# Patient Record
Sex: Female | Born: 1945 | Race: White | Hispanic: No | Marital: Married | Smoking: Former smoker
Health system: Southern US, Community
[De-identification: ages and names within clinical notes are randomized; demographics above are authoritative.]

## PROBLEM LIST (undated history)

## (undated) DIAGNOSIS — E782 Mixed hyperlipidemia: Secondary | ICD-10-CM

## (undated) DIAGNOSIS — E079 Disorder of thyroid, unspecified: Secondary | ICD-10-CM

## (undated) DIAGNOSIS — I10 Essential (primary) hypertension: Secondary | ICD-10-CM

## (undated) DIAGNOSIS — C4490 Unspecified malignant neoplasm of skin, unspecified: Secondary | ICD-10-CM

## (undated) DIAGNOSIS — H9192 Unspecified hearing loss, left ear: Secondary | ICD-10-CM

## (undated) DIAGNOSIS — K219 Gastro-esophageal reflux disease without esophagitis: Secondary | ICD-10-CM

## (undated) DIAGNOSIS — T7840XA Allergy, unspecified, initial encounter: Secondary | ICD-10-CM

## (undated) DIAGNOSIS — L409 Psoriasis, unspecified: Secondary | ICD-10-CM

## (undated) DIAGNOSIS — K635 Polyp of colon: Secondary | ICD-10-CM

## (undated) HISTORY — DX: Allergy, unspecified, initial encounter: T78.40XA

## (undated) HISTORY — DX: Essential (primary) hypertension: I10

## (undated) HISTORY — DX: Gastro-esophageal reflux disease without esophagitis: K21.9

## (undated) HISTORY — PX: WRIST SURGERY: SHX841

## (undated) HISTORY — DX: Unspecified hearing loss, left ear: H91.92

## (undated) HISTORY — DX: Polyp of colon: K63.5

## (undated) HISTORY — PX: ABDOMINAL HYSTERECTOMY: SHX81

## (undated) HISTORY — DX: Disorder of thyroid, unspecified: E07.9

## (undated) HISTORY — DX: Unspecified malignant neoplasm of skin, unspecified: C44.90

## (undated) HISTORY — DX: Psoriasis, unspecified: L40.9

## (undated) HISTORY — DX: Mixed hyperlipidemia: E78.2

## (undated) HISTORY — PX: COLONOSCOPY: SHX174

---

## 1998-07-13 ENCOUNTER — Ambulatory Visit (HOSPITAL_COMMUNITY): Admission: RE | Admit: 1998-07-13 | Discharge: 1998-07-13 | Payer: Self-pay | Admitting: Obstetrics and Gynecology

## 1998-09-07 ENCOUNTER — Other Ambulatory Visit: Admission: RE | Admit: 1998-09-07 | Discharge: 1998-09-07 | Payer: Self-pay | Admitting: Obstetrics and Gynecology

## 1999-07-19 ENCOUNTER — Encounter: Payer: Self-pay | Admitting: Obstetrics and Gynecology

## 1999-07-19 ENCOUNTER — Ambulatory Visit (HOSPITAL_COMMUNITY): Admission: RE | Admit: 1999-07-19 | Discharge: 1999-07-19 | Payer: Self-pay | Admitting: Obstetrics and Gynecology

## 1999-09-28 ENCOUNTER — Other Ambulatory Visit: Admission: RE | Admit: 1999-09-28 | Discharge: 1999-09-28 | Payer: Self-pay | Admitting: Obstetrics and Gynecology

## 1999-11-03 ENCOUNTER — Encounter (INDEPENDENT_AMBULATORY_CARE_PROVIDER_SITE_OTHER): Payer: Self-pay | Admitting: Specialist

## 1999-11-03 ENCOUNTER — Other Ambulatory Visit: Admission: RE | Admit: 1999-11-03 | Discharge: 1999-11-03 | Payer: Self-pay | Admitting: Internal Medicine

## 1999-11-06 ENCOUNTER — Emergency Department (HOSPITAL_COMMUNITY): Admission: EM | Admit: 1999-11-06 | Discharge: 1999-11-06 | Payer: Self-pay | Admitting: Emergency Medicine

## 2000-07-24 ENCOUNTER — Encounter: Payer: Self-pay | Admitting: Emergency Medicine

## 2000-07-24 ENCOUNTER — Ambulatory Visit (HOSPITAL_COMMUNITY): Admission: RE | Admit: 2000-07-24 | Discharge: 2000-07-24 | Payer: Self-pay | Admitting: Obstetrics and Gynecology

## 2000-11-15 ENCOUNTER — Encounter: Payer: Self-pay | Admitting: Emergency Medicine

## 2000-11-15 ENCOUNTER — Encounter: Admission: RE | Admit: 2000-11-15 | Discharge: 2000-11-15 | Payer: Self-pay | Admitting: Emergency Medicine

## 2001-09-03 ENCOUNTER — Encounter: Payer: Self-pay | Admitting: Emergency Medicine

## 2001-09-03 ENCOUNTER — Ambulatory Visit (HOSPITAL_COMMUNITY): Admission: RE | Admit: 2001-09-03 | Discharge: 2001-09-03 | Payer: Self-pay | Admitting: Emergency Medicine

## 2002-09-03 ENCOUNTER — Ambulatory Visit (HOSPITAL_COMMUNITY): Admission: RE | Admit: 2002-09-03 | Discharge: 2002-09-03 | Payer: Self-pay | Admitting: Obstetrics and Gynecology

## 2002-09-03 ENCOUNTER — Encounter: Payer: Self-pay | Admitting: Obstetrics and Gynecology

## 2003-09-08 ENCOUNTER — Ambulatory Visit (HOSPITAL_COMMUNITY): Admission: RE | Admit: 2003-09-08 | Discharge: 2003-09-08 | Payer: Self-pay | Admitting: Obstetrics and Gynecology

## 2003-09-08 ENCOUNTER — Encounter: Payer: Self-pay | Admitting: Obstetrics and Gynecology

## 2004-09-13 ENCOUNTER — Ambulatory Visit (HOSPITAL_COMMUNITY): Admission: RE | Admit: 2004-09-13 | Discharge: 2004-09-13 | Payer: Self-pay | Admitting: Obstetrics and Gynecology

## 2005-09-19 ENCOUNTER — Ambulatory Visit (HOSPITAL_COMMUNITY): Admission: RE | Admit: 2005-09-19 | Discharge: 2005-09-19 | Payer: Self-pay | Admitting: Obstetrics and Gynecology

## 2006-09-24 ENCOUNTER — Ambulatory Visit (HOSPITAL_COMMUNITY): Admission: RE | Admit: 2006-09-24 | Discharge: 2006-09-24 | Payer: Self-pay | Admitting: Obstetrics and Gynecology

## 2006-10-09 ENCOUNTER — Encounter: Admission: RE | Admit: 2006-10-09 | Discharge: 2006-10-09 | Payer: Self-pay | Admitting: Obstetrics and Gynecology

## 2007-09-30 ENCOUNTER — Ambulatory Visit (HOSPITAL_COMMUNITY): Admission: RE | Admit: 2007-09-30 | Discharge: 2007-09-30 | Payer: Self-pay | Admitting: Obstetrics and Gynecology

## 2008-10-01 ENCOUNTER — Ambulatory Visit (HOSPITAL_COMMUNITY): Admission: RE | Admit: 2008-10-01 | Discharge: 2008-10-01 | Payer: Self-pay | Admitting: Obstetrics and Gynecology

## 2009-10-07 ENCOUNTER — Ambulatory Visit (HOSPITAL_COMMUNITY): Admission: RE | Admit: 2009-10-07 | Discharge: 2009-10-07 | Payer: Self-pay | Admitting: Obstetrics and Gynecology

## 2010-01-26 ENCOUNTER — Encounter (INDEPENDENT_AMBULATORY_CARE_PROVIDER_SITE_OTHER): Payer: Self-pay | Admitting: *Deleted

## 2010-01-27 ENCOUNTER — Ambulatory Visit: Payer: Self-pay | Admitting: Internal Medicine

## 2010-02-22 ENCOUNTER — Ambulatory Visit: Payer: Self-pay | Admitting: Internal Medicine

## 2010-10-11 ENCOUNTER — Ambulatory Visit (HOSPITAL_COMMUNITY): Admission: RE | Admit: 2010-10-11 | Discharge: 2010-10-11 | Payer: Self-pay | Admitting: Obstetrics and Gynecology

## 2010-10-29 ENCOUNTER — Ambulatory Visit: Payer: Self-pay | Admitting: Diagnostic Radiology

## 2010-10-29 ENCOUNTER — Emergency Department (HOSPITAL_BASED_OUTPATIENT_CLINIC_OR_DEPARTMENT_OTHER): Admission: EM | Admit: 2010-10-29 | Discharge: 2010-10-29 | Payer: Self-pay | Admitting: Emergency Medicine

## 2010-11-02 ENCOUNTER — Encounter: Admission: RE | Admit: 2010-11-02 | Discharge: 2010-11-02 | Payer: Self-pay | Admitting: Family Medicine

## 2011-01-07 ENCOUNTER — Encounter: Payer: Self-pay | Admitting: Obstetrics and Gynecology

## 2011-01-07 ENCOUNTER — Encounter: Payer: Self-pay | Admitting: Emergency Medicine

## 2011-01-17 NOTE — Letter (Signed)
Summary: [facility] Instructions  [facility]  [address] [place], [place] [address]   Phone: [phone]  Fax: [phone]       Terri Sosa    Feb 14, 1946    MRN: [id number]       Procedure Day Dorna Bloom:  Lenor Coffin  02/10/2010     Arrival Time: 7:30AM     Procedure Time:  8:30AM     Location of Procedure:                    _ X_  [facility] (4th Floor)        PREPARATION FOR COLONOSCOPY WITH MIRALAX  Starting 5 days prior to your procedure 02/05/10 do not eat nuts, seeds, popcorn, corn, beans, peas,  salads, or any raw vegetables.  Do not take any fiber supplements (e.g. Metamucil, Citrucel, and Benefiber). ____________________________________________________________________________________________________   THE DAY BEFORE YOUR PROCEDURE         DATE: 02/09/10 DAY: WEDNESDAY  1   Drink clear liquids the entire day-NO SOLID FOOD  2   Do not drink anything colored red or purple.  Avoid juices with pulp.  No orange juice.  3   Drink at least 64 oz. (8 glasses) of fluid/clear liquids during the day to prevent dehydration and help the prep work efficiently.  CLEAR LIQUIDS INCLUDE: Water Jello Ice Popsicles Tea (sugar ok, no milk/cream) Powdered fruit flavored drinks Coffee (sugar ok, no milk/cream) Gatorade Juice: apple, white grape, white cranberry  Lemonade Clear bullion, consomm, broth Carbonated beverages (any kind) Strained chicken noodle soup Hard Candy  4   Mix the entire bottle of Miralax with 64 oz. of Gatorade/Powerade in the morning and put in the refrigerator to chill.  5   At 3:00 pm take 2 Dulcolax/Bisacodyl tablets.  6   At 4:30 pm take one Reglan/Metoclopramide tablet.  7  Starting at 5:00 pm drink one 8 oz glass of the Miralax mixture every 15-20 minutes until you have finished drinking the entire 64 oz.  You should finish drinking prep around 7:30 or 8:00 pm.  8   If you are nauseated, you may take the 2nd  Reglan/Metoclopramide tablet at 6:30 pm.        9    At 8:00 pm take 2 more DULCOLAX/Bisacodyl tablets.     THE DAY OF YOUR PROCEDURE      DATE:  02/10/10  DAY: Lenor Coffin  You may drink clear liquids until 6:30AM  (2 HOURS BEFORE PROCEDURE).   MEDICATION INSTRUCTIONS  Unless otherwise instructed, you should take regular prescription medications with a small sip of water as early as possible the morning of your procedure.   Additional medication instructions: Hold Lisinopril-HCTZ the morning of procedure.         OTHER INSTRUCTIONS  You will need a responsible adult at least 63 years of age to accompany you and drive you home.   This person must remain in the waiting room during your procedure.  Wear loose fitting clothing that is easily removed.  Leave jewelry and other valuables at home.  However, you may wish to bring a book to read or an iPod/MP3 player to listen to music as you wait for your procedure to start.  Remove all body piercing jewelry and leave at home.  Total time from sign-in until discharge is approximately 2-3 hours.  You should go home directly after your procedure and rest.  You can resume normal activities the day after your  procedure.  The day of your procedure you should not:   Drive   Make legal decisions   Operate machinery   Drink alcohol   Return to work  You will receive specific instructions about eating, activities and medications before you leave.   The above instructions have been reviewed and explained to me by   Wyona Almas RN  January 27, 2010 5:13 PM     I fully understand and can verbalize these instructions _____________________________ Date _______

## 2011-01-17 NOTE — Miscellaneous (Signed)
Summary: [facility] Previsit/prep  Clinical Lists Changes  Medications: Added new medication of MIRALAX   POWD (POLYETHYLENE GLYCOL 3350) As per prep  instructions. - Signed Added new medication of METOCLOPRAMIDE HCL 10 MG  TABS (METOCLOPRAMIDE HCL) As per prep instructions. - Signed Added new medication of DULCOLAX 5 MG  TBEC (BISACODYL) Day before procedure take 2 at 3pm and 2 at 8pm. - Signed Rx of MIRALAX   POWD (POLYETHYLENE GLYCOL 3350) As per prep  instructions.;  #255gm x 0;  Signed;  Entered by: Wyona Almas RN;  Authorized by: Hart Carwin MD;  Method used: [facility] to [facility] [phone]*, [address], [place], [place], [place]  [address], Ph: [phone], Fax: [phone] Rx of METOCLOPRAMIDE HCL 10 MG  TABS (METOCLOPRAMIDE HCL) As per prep instructions.;  #2 x 0;  Signed;  Entered by: Wyona Almas RN;  Authorized by: Hart Carwin MD;  Method used: Electronically to [facility] [phone]*, [address] [place], [place], [place]  [address], Ph: [phone], Fax: [phone] Rx of DULCOLAX 5 MG  TBEC (BISACODYL) Day before procedure take 2 at 3pm and 2 at 8pm.;  #4 x 0;  Signed;  Entered by: Wyona Almas RN;  Authorized by: Hart Carwin MD;  Method used: Electronically to [facility] [phone]*, [address], [place], [place]  [address], Ph: [phone], Fax: [phone] Allergies: Added new allergy or adverse reaction of CODEINE Added new allergy or adverse reaction of AUGMENTIN Observations: Added new observation of NKA: F (01/27/2010 16:06)    Prescriptions: DULCOLAX 5 MG  TBEC (BISACODYL) Day before procedure take 2 at 3pm and 2 at 8pm.  #4 x 0   Entered by:   Wyona Almas RN   Authorized by:   Hart Carwin MD   Signed by:   Wyona Almas RN on 01/27/2010   Method used:   Electronically to        [facility] [phone]* (retail)       [address]       [place], [place]   [address]       Ph: [phone]       Fax: [phone]   RxID:   [id number] METOCLOPRAMIDE HCL 10 MG  TABS (METOCLOPRAMIDE HCL) As per prep instructions.  #2 x 0   Entered by:   Wyona Almas RN   Authorized by:   Hart Carwin MD   Signed by:   Wyona Almas RN on 01/27/2010   Method used:   Electronically to        [facility] [phone]* (retail)       [address]       [place], [place]  [address]       Ph: [phone]       Fax: [phone]   RxID:   [id number] MIRALAX   POWD (POLYETHYLENE GLYCOL 3350) As per prep  instructions.  #255gm x 0   Entered by:   Wyona Almas RN   Authorized by:   Hart Carwin MD   Signed by:   Wyona Almas RN on 01/27/2010   Method used:   Electronically to        [facility] [phone]* (retail)       [address]       [place],  [place]  [address]       Ph: [phone]       Fax: [phone]   RxID:   [id number]

## 2011-01-17 NOTE — Procedures (Signed)
Summary: Colonoscopy  Patient: Terri Sosa Note: All result statuses are Final unless otherwise noted.  Tests: (1) Colonoscopy (COL)   COL Colonoscopy           DONE     [facility]     [address]     [place], [place]  [address]           COLONOSCOPY PROCEDURE REPORT           PATIENT:  Terri Sosa, Terri Sosa  MR#:  [id number]     BIRTHDATE:  November 22, 1946, 64 yrs. old  GENDER:  female           ENDOSCOPIST:  Hedwig Morton. Juanda Chance, MD     Referred by:  Kennedy Bucker, PA           PROCEDURE DATE:  02/22/2010     PROCEDURE:  Colonoscopy [address]     ASA CLASS:  Class I     INDICATIONS:  aden. polyp 2000, no polyp 2004           MEDICATIONS:   Versed 10 mg, Fentanyl 100 mcg           DESCRIPTION OF PROCEDURE:   After the risks benefits and     alternatives of the procedure were thoroughly explained, informed     consent was obtained.  Digital rectal exam was performed and     revealed no abnormalities.   The LB [id number] endoscope     was introduced through the anus and advanced to the cecum, which     was identified by both the appendix and ileocecal valve, without     limitations.  The quality of the prep was good, using MiraLax.     The instrument was then slowly withdrawn as the colon was fully     examined.     <<PROCEDUREIMAGES>>           FINDINGS:  No polyps or cancers were seen (see image1, image2,     image3, image5, and image6).   Retroflexed views in the rectum     revealed no abnormalities.    The scope was then withdrawn from     the patient and the procedure completed.           COMPLICATIONS:  None           ENDOSCOPIC IMPRESSION:     1) No polyps or cancers     2) Normal colonoscopy     RECOMMENDATIONS:     1) high fiber diet           REPEAT EXAM:  In 10 year(s) for.           ______________________________     Hedwig Morton. Juanda Chance, MD           CC:           n.     eSIGNED:   Hedwig Morton. Kieran Nachtigal at 02/22/2010 11:43 AM           Lennie Muckle,  [id number]  Note: An exclamation mark (!) indicates a result that was not dispersed into the flowsheet. Document Creation Date: 02/22/2010 11:44 AM _______________________________________________________________________  (1) Order result status: Final Collection or observation date-time: 02/22/2010 11:38 Requested date-time:  Receipt date-time:  Reported date-time:  Referring Physician:   Ordering Physician: Lina Sar [phone]) Specimen Source:  Source: Launa Grill Order Number: [id number] Lab site:   Appended Document: Colonoscopy    Clinical Lists Changes  Observations: Added  new observation of COLONNXTDUE: 02/2020 (02/22/2010 12:01)

## 2011-05-24 ENCOUNTER — Other Ambulatory Visit: Payer: Self-pay | Admitting: Orthopedic Surgery

## 2011-05-24 DIAGNOSIS — M542 Cervicalgia: Secondary | ICD-10-CM

## 2011-05-25 ENCOUNTER — Ambulatory Visit
Admission: RE | Admit: 2011-05-25 | Discharge: 2011-05-25 | Disposition: A | Discharge status: Home / Self Care | Payer: BC Managed Care – PPO | Source: Ambulatory Visit | Attending: Orthopedic Surgery | Admitting: Orthopedic Surgery

## 2011-05-25 DIAGNOSIS — M542 Cervicalgia: Secondary | ICD-10-CM

## 2011-09-05 ENCOUNTER — Other Ambulatory Visit: Payer: Self-pay | Admitting: Internal Medicine

## 2011-09-05 DIAGNOSIS — Z1231 Encounter for screening mammogram for malignant neoplasm of breast: Secondary | ICD-10-CM

## 2011-10-03 ENCOUNTER — Ambulatory Visit (HOSPITAL_COMMUNITY)
Admission: RE | Admit: 2011-10-03 | Discharge: 2011-10-03 | Disposition: A | Discharge status: Home / Self Care | Payer: BC Managed Care – PPO | Source: Ambulatory Visit | Attending: Internal Medicine | Admitting: Internal Medicine

## 2011-10-03 DIAGNOSIS — Z1231 Encounter for screening mammogram for malignant neoplasm of breast: Secondary | ICD-10-CM | POA: Insufficient documentation

## 2011-10-03 LAB — HM MAMMOGRAPHY: HM Mammogram: NEGATIVE

## 2011-10-06 ENCOUNTER — Encounter: Payer: Self-pay | Admitting: Emergency Medicine

## 2011-10-06 DIAGNOSIS — I10 Essential (primary) hypertension: Secondary | ICD-10-CM | POA: Insufficient documentation

## 2011-10-06 DIAGNOSIS — E782 Mixed hyperlipidemia: Secondary | ICD-10-CM

## 2011-10-06 DIAGNOSIS — K219 Gastro-esophageal reflux disease without esophagitis: Secondary | ICD-10-CM | POA: Insufficient documentation

## 2011-10-06 DIAGNOSIS — L409 Psoriasis, unspecified: Secondary | ICD-10-CM | POA: Insufficient documentation

## 2011-10-06 DIAGNOSIS — H9192 Unspecified hearing loss, left ear: Secondary | ICD-10-CM | POA: Insufficient documentation

## 2011-10-17 ENCOUNTER — Ambulatory Visit (HOSPITAL_COMMUNITY): Payer: BC Managed Care – PPO

## 2011-12-05 ENCOUNTER — Other Ambulatory Visit: Payer: Self-pay | Admitting: Dermatology

## 2012-06-06 ENCOUNTER — Other Ambulatory Visit: Payer: Self-pay | Admitting: Dermatology

## 2012-09-05 ENCOUNTER — Other Ambulatory Visit (HOSPITAL_COMMUNITY): Payer: Self-pay | Admitting: *Deleted

## 2012-09-05 DIAGNOSIS — Z1231 Encounter for screening mammogram for malignant neoplasm of breast: Secondary | ICD-10-CM

## 2012-10-08 ENCOUNTER — Ambulatory Visit (HOSPITAL_COMMUNITY)
Admission: RE | Admit: 2012-10-08 | Discharge: 2012-10-08 | Disposition: A | Discharge status: Home / Self Care | Payer: BC Managed Care – PPO | Source: Ambulatory Visit | Attending: Obstetrics & Gynecology | Admitting: Obstetrics & Gynecology

## 2012-10-08 DIAGNOSIS — Z1231 Encounter for screening mammogram for malignant neoplasm of breast: Secondary | ICD-10-CM | POA: Insufficient documentation

## 2013-07-02 ENCOUNTER — Other Ambulatory Visit: Payer: Self-pay | Admitting: Dermatology

## 2013-09-24 ENCOUNTER — Other Ambulatory Visit (HOSPITAL_COMMUNITY): Payer: Self-pay | Admitting: Obstetrics & Gynecology

## 2013-09-24 DIAGNOSIS — Z1231 Encounter for screening mammogram for malignant neoplasm of breast: Secondary | ICD-10-CM

## 2013-10-09 ENCOUNTER — Ambulatory Visit (HOSPITAL_COMMUNITY): Payer: BC Managed Care – PPO

## 2013-10-14 ENCOUNTER — Ambulatory Visit (HOSPITAL_COMMUNITY)
Admission: RE | Admit: 2013-10-14 | Discharge: 2013-10-14 | Disposition: A | Discharge status: Home / Self Care | Payer: Medicare Other | Source: Ambulatory Visit | Attending: Obstetrics & Gynecology | Admitting: Obstetrics & Gynecology

## 2013-10-14 DIAGNOSIS — Z1231 Encounter for screening mammogram for malignant neoplasm of breast: Secondary | ICD-10-CM

## 2014-01-13 ENCOUNTER — Other Ambulatory Visit: Payer: Self-pay | Admitting: Dermatology

## 2014-04-01 ENCOUNTER — Other Ambulatory Visit: Payer: Self-pay | Admitting: Dermatology

## 2014-06-06 DIAGNOSIS — N393 Stress incontinence (female) (male): Secondary | ICD-10-CM | POA: Insufficient documentation

## 2014-06-06 DIAGNOSIS — J309 Allergic rhinitis, unspecified: Secondary | ICD-10-CM | POA: Insufficient documentation

## 2014-07-03 ENCOUNTER — Other Ambulatory Visit: Payer: Self-pay | Admitting: Dermatology

## 2014-08-26 ENCOUNTER — Other Ambulatory Visit: Payer: Self-pay | Admitting: Dermatology

## 2014-09-21 ENCOUNTER — Other Ambulatory Visit (HOSPITAL_COMMUNITY): Payer: Self-pay | Admitting: Obstetrics & Gynecology

## 2014-09-21 DIAGNOSIS — Z1231 Encounter for screening mammogram for malignant neoplasm of breast: Secondary | ICD-10-CM

## 2014-10-16 ENCOUNTER — Ambulatory Visit (HOSPITAL_COMMUNITY)
Admission: RE | Admit: 2014-10-16 | Discharge: 2014-10-16 | Disposition: A | Discharge status: Home / Self Care | Payer: Medicare Other | Source: Ambulatory Visit | Attending: Obstetrics & Gynecology | Admitting: Obstetrics & Gynecology

## 2014-10-16 DIAGNOSIS — Z1231 Encounter for screening mammogram for malignant neoplasm of breast: Secondary | ICD-10-CM | POA: Insufficient documentation

## 2015-01-27 ENCOUNTER — Encounter: Payer: Self-pay | Admitting: Dermatology

## 2015-05-20 ENCOUNTER — Telehealth: Payer: Self-pay | Admitting: Internal Medicine

## 2015-05-20 NOTE — Telephone Encounter (Signed)
Received records from [facility] forwarded to Dr. Olevia Perches 05/20/15 fbg.

## 2015-10-05 ENCOUNTER — Other Ambulatory Visit: Payer: Self-pay

## 2015-10-05 DIAGNOSIS — Z1231 Encounter for screening mammogram for malignant neoplasm of breast: Secondary | ICD-10-CM

## 2015-10-18 ENCOUNTER — Ambulatory Visit: Payer: Medicare Other

## 2015-10-19 ENCOUNTER — Ambulatory Visit
Admission: RE | Admit: 2015-10-19 | Discharge: 2015-10-19 | Disposition: A | Discharge status: Home / Self Care | Payer: Medicare Other | Source: Ambulatory Visit

## 2015-10-19 DIAGNOSIS — Z1231 Encounter for screening mammogram for malignant neoplasm of breast: Secondary | ICD-10-CM

## 2015-12-19 HISTORY — PX: PELVIC FLOOR REPAIR: SHX2192

## 2016-05-17 ENCOUNTER — Encounter: Payer: Self-pay | Admitting: Internal Medicine

## 2016-11-23 DIAGNOSIS — Z79890 Hormone replacement therapy (postmenopausal): Secondary | ICD-10-CM | POA: Insufficient documentation

## 2017-04-26 DIAGNOSIS — M654 Radial styloid tenosynovitis [de Quervain]: Secondary | ICD-10-CM | POA: Insufficient documentation

## 2017-08-10 IMAGING — MG MM SCREENING BREAST TOMO BILATERAL
8 series · 8 of 24 positions shown · non-contrast
Comparison: Previous exam(s).

CLINICAL DATA: Screening.

EXAM:
DIGITAL SCREENING BILATERAL MAMMOGRAM WITH 3D TOMO WITH CAD

[L CC]
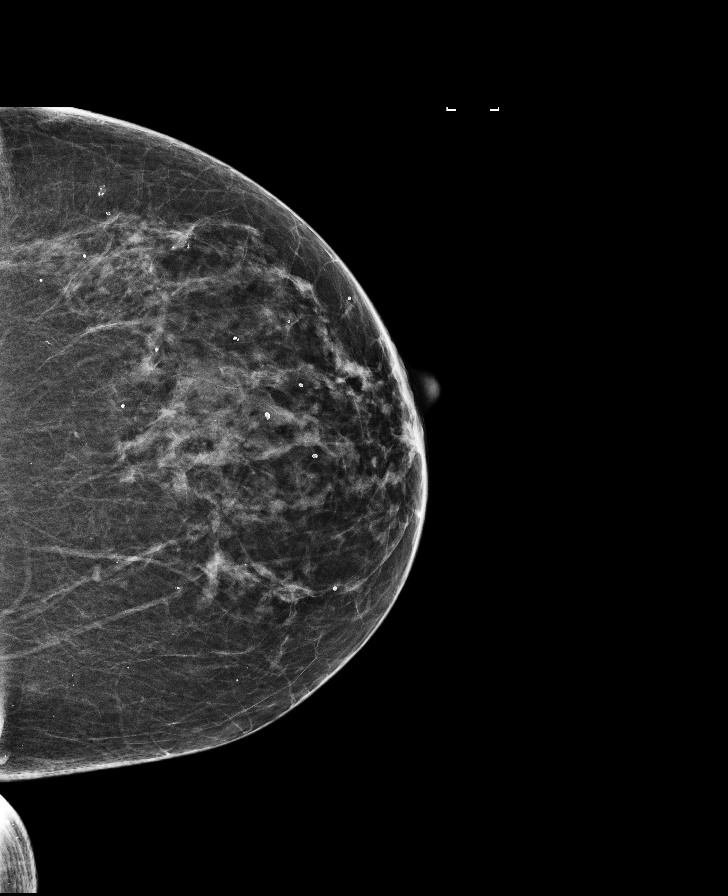

[R MLO]
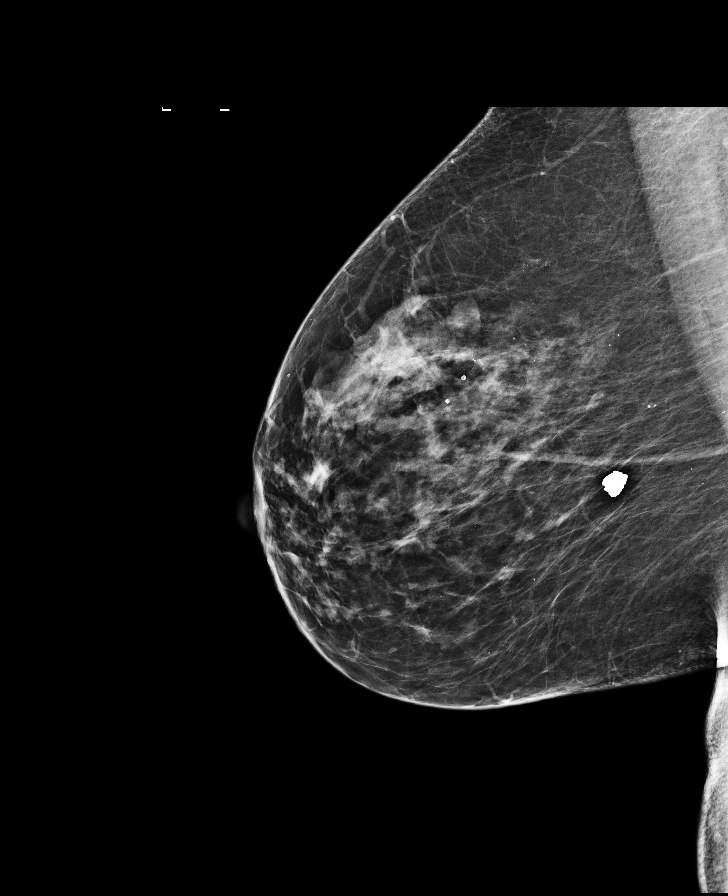

[R CC]
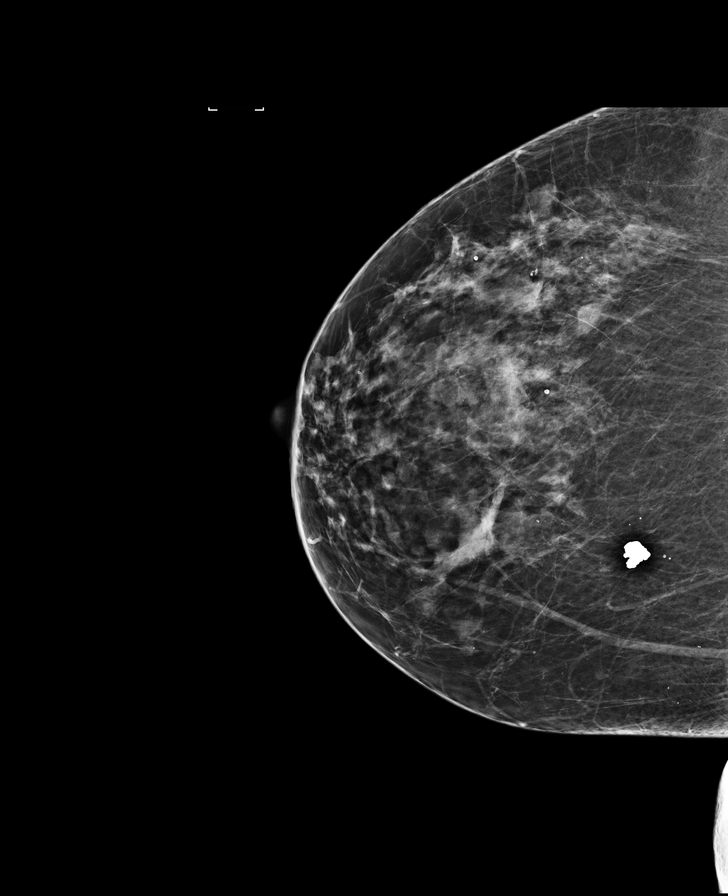

[L MLO]
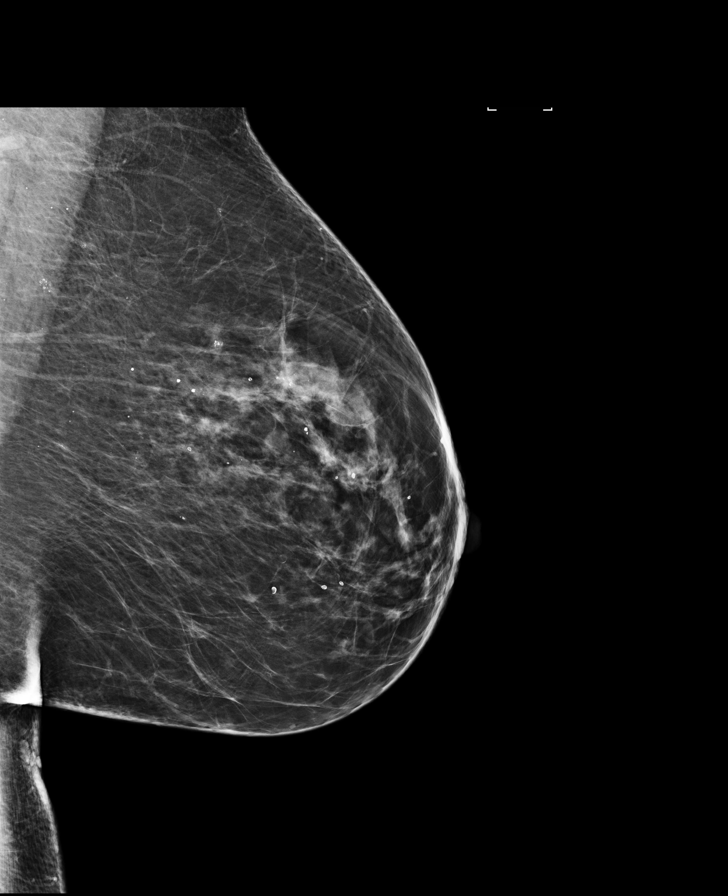

[R CC tomo · tomo slice 33/64.0]
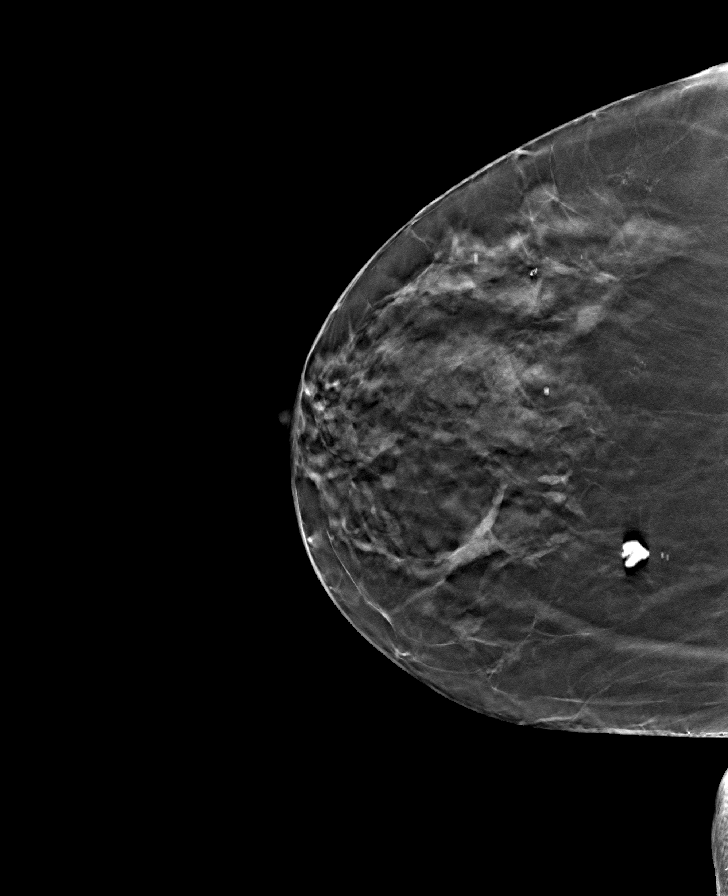

[L CC tomo · tomo slice 32/63.0]
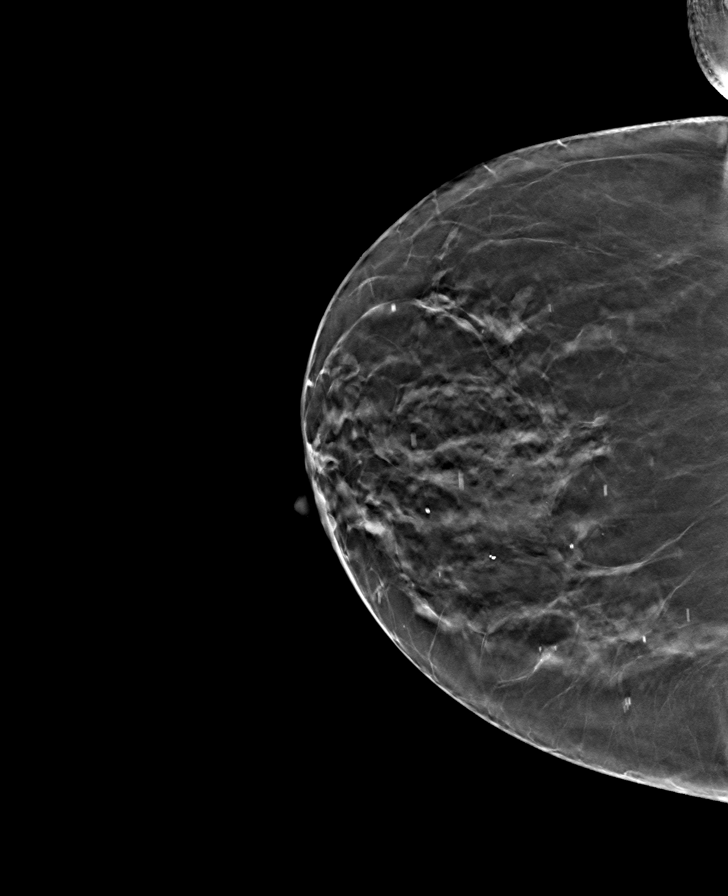

[R MLO tomo · tomo slice 35/70.0]
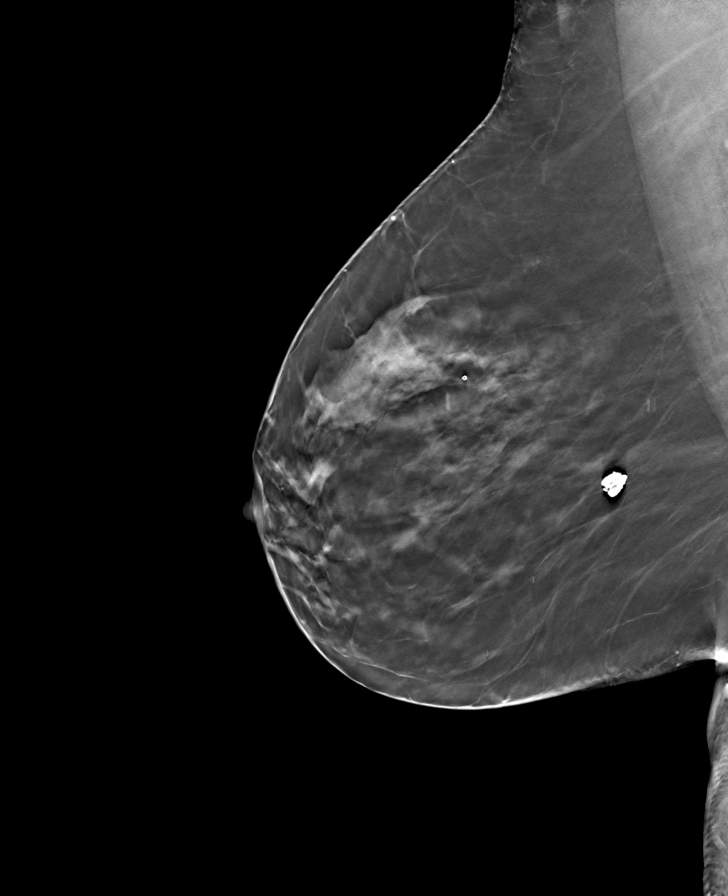

[L MLO tomo · tomo slice 40/79.0]
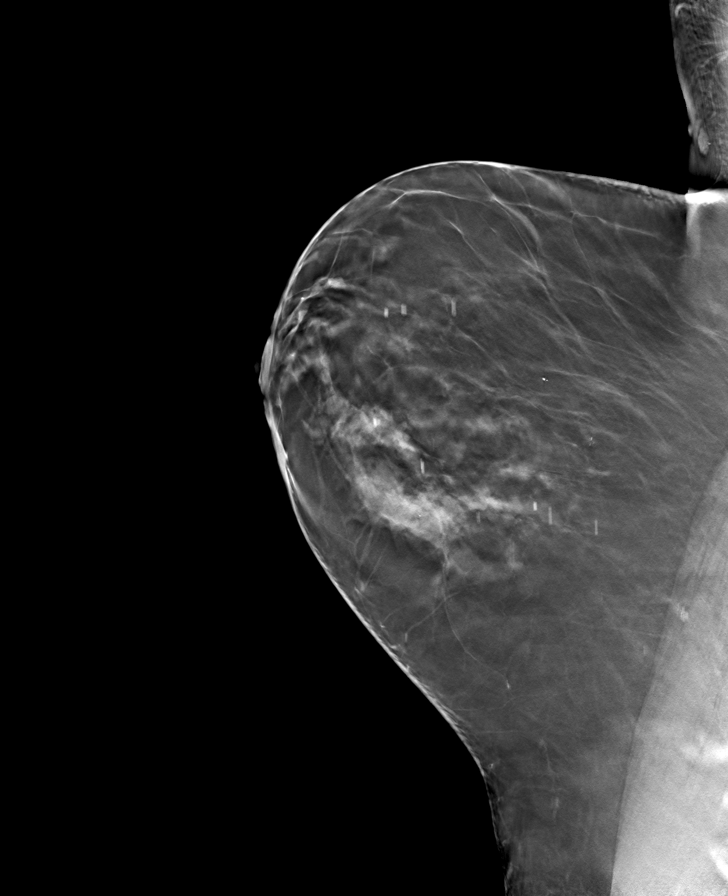

[8 of 24 positions shown; findings below may reference images not displayed]

ACR Breast Density Category c: The breast tissue is heterogeneously
dense, which may obscure small masses.
FINDINGS: There are no findings suspicious for malignancy. Images were
processed with CAD.
IMPRESSION: No mammographic evidence of malignancy. A result letter of this
screening mammogram will be mailed directly to the patient.

RECOMMENDATION:
Screening mammogram in one year. (Code:OA-G-1SS)

BI-RADS CATEGORY  1: Negative.

## 2017-08-14 DIAGNOSIS — E039 Hypothyroidism, unspecified: Secondary | ICD-10-CM | POA: Insufficient documentation

## 2020-01-10 ENCOUNTER — Ambulatory Visit: Payer: Medicare Other | Attending: Internal Medicine

## 2020-01-10 DIAGNOSIS — Z23 Encounter for immunization: Secondary | ICD-10-CM | POA: Insufficient documentation

## 2020-01-10 NOTE — Progress Notes (Signed)
   Covid-19 Vaccination Clinic  Name:  Terri Sosa    MRN: [id number] DOB: [birth year]  01/10/2020  Ms. Terri Sosa was observed post Covid-19 immunization for 15 minutes without incidence. She was provided with Vaccine Information Sheet and instruction to access the V-Safe system.   Ms. Terri Sosa was instructed to call 911 with any severe reactions post vaccine: Marland Kitchen Difficulty breathing  . Swelling of your face and throat  . A fast heartbeat  . A bad rash all over your body  . Dizziness and weakness    Immunizations Administered    Name Date Dose VIS Date Route   Pfizer COVID-19 Vaccine 01/10/2020  1:34 PM 0.3 mL 11/28/2019 Intramuscular   Manufacturer: [place]   Lot: [id number]   [place]: [id number]

## 2020-01-31 ENCOUNTER — Ambulatory Visit: Payer: Medicare Other | Attending: Internal Medicine

## 2020-01-31 DIAGNOSIS — Z23 Encounter for immunization: Secondary | ICD-10-CM

## 2020-01-31 NOTE — Progress Notes (Signed)
   Covid-19 Vaccination Clinic  Name:  Terri Sosa    MRN: [id number] DOB: Sep 07, 1946  01/31/2020  [name] was observed post Covid-19 immunization for 15 minutes without incidence. She was provided with Vaccine Information Sheet and instruction to access the V-Safe system.   [name] was instructed to call 911 with any severe reactions post vaccine: Marland Kitchen Difficulty breathing  . Swelling of your face and throat  . A fast heartbeat  . A bad rash all over your body  . Dizziness and weakness    Immunizations Administered    Name Date Dose VIS Date Route   Pfizer COVID-19 Vaccine 01/31/2020 10:55 AM 0.3 mL 11/28/2019 Intramuscular   Manufacturer: [place]   Lot: [id number]   [place]: [id number]

## 2020-03-04 ENCOUNTER — Encounter: Payer: Self-pay | Admitting: Gastroenterology

## 2020-03-25 ENCOUNTER — Other Ambulatory Visit (INDEPENDENT_AMBULATORY_CARE_PROVIDER_SITE_OTHER): Payer: Medicare Other

## 2020-03-25 ENCOUNTER — Encounter: Payer: Self-pay | Admitting: Gastroenterology

## 2020-03-25 ENCOUNTER — Ambulatory Visit: Payer: Medicare Other | Admitting: Gastroenterology

## 2020-03-25 VITALS — BP 140/86 | HR 68 | Temp 98.2°F | Ht 67.0 in | Wt 214.0 lb

## 2020-03-25 DIAGNOSIS — K760 Fatty (change of) liver, not elsewhere classified: Secondary | ICD-10-CM

## 2020-03-25 DIAGNOSIS — Z86010 Personal history of colonic polyps: Secondary | ICD-10-CM

## 2020-03-25 DIAGNOSIS — R1011 Right upper quadrant pain: Secondary | ICD-10-CM

## 2020-03-25 LAB — FERRITIN: Ferritin: 347.7 ng/mL — ABNORMAL HIGH (ref 10.0–291.0)

## 2020-03-25 LAB — IRON: Iron: 112 ug/dL (ref 42–145)

## 2020-03-25 MED ORDER — OMEPRAZOLE 40 MG PO CPDR: 40.0000 mg | DELAYED_RELEASE_CAPSULE | Freq: Every day | ORAL | 3 refills | Status: DC

## 2020-03-25 MED ORDER — NA SULFATE-K SULFATE-MG SULF 17.5-3.13-1.6 GM/177ML PO SOLN: 1.0000 | ORAL | 0 refills | Status: DC

## 2020-03-25 NOTE — Patient Instructions (Addendum)
We have sent the following medications to your pharmacy for you to pick up at your convenience: Omeprazole 40 mg every morning 30-60 minutes before breakfast   Your provider has requested that you go to the basement level for lab work before leaving today. Press "B" on the elevator. The lab is located at the first door on the left as you exit the elevator.  You have been scheduled for an abdominal ultrasound at [facility] (1st floor of hospital) on 04-05-20 at 9:30 am. Please arrive 15 minutes prior to your appointment for registration. Make certain not to have anything to eat or drink 6 hours prior to your appointment. Should you need to reschedule your appointment, please contact radiology at [phone]. This test typically takes about 30 minutes to perform.  You have been scheduled for an endoscopy and colonoscopy. Please follow the written instructions given to you at your visit today. Please pick up your prep supplies at the pharmacy within the next 1-3 days. If you use inhalers (even only as needed), please bring them with you on the day of your procedure.   I value your feedback and thank you for entrusting [place] with your care. If you get a [facility] patient survey, I would appreciate you taking the time to let us know about your experience today. Thank you!   Due to recent changes in healthcare laws, you may see the results of your imaging and laboratory studies on MyChart before your provider has had a chance to review them.  We understand that in some cases there may be results that are confusing or concerning to you. Not all laboratory results come back in the same time frame and the provider may be waiting for multiple results in order to interpret others.  Please give [place] 48 hours in order for your provider to thoroughly review all the results before contacting the office for clarification of your results.

## 2020-03-25 NOTE — Progress Notes (Signed)
Referring Provider: Delilah Shan, MD Primary Care Physician:  Delilah Shan, MD  Reason for Consultation: Right upper quadrant pain   IMPRESSION:  Right upper quadrant pain x1 year    -Source identified on CMP or abdominal ultrasound Abnormal ALT with fatty liver seen on ultrasound History of colon polyps    -Cecal tubulovillous adenoma 2000    -Normal colonoscopies 2004 and 2011 Chronic constipation Hiatal hernia on CT scan 2001 Prediabetes BMI 33.52   RUQ: Differential includes esophagitis, gastritis, H pylori, peptic ulcer disease, gastroesophageal reflux, gastroparesis, symptomatic gallbladder, and functional dyspepsia. Could be capsular stretch although less likely as there is no obvious hepatomegaly on my review of her ultrasound images. Recommend EGD with biopsies. Will started empiric PPI.  Suspected fatty liver given elevated ALT and and ultrasound findings. Labs to exclude concurrent liver disease and to stage her suspected NAFLD given the concurrent prediabetes given elastography and FibroSURE.   Personal history of colon polyps: Surveillance colonoscopy recommended.    PLAN: - Omeprazole 40 mg QAM - EGD - Colonoscopy - HIDA scan if the EGD is nondiagnostic - Labs today: Hepatitis C antibody, hepatitis B surface antigen, hepatitis B core antibody, fasting ferritin, iron, ANA, AMA, anti-smooth muscle antibody, IgG, IgM - FibroSure - Elastography  I spent 45 minutes, including in depth chart review, independent review of results, communicating results with the patient directly, face-to-face time with the patient, coordinating care, ordering studies and medications as appropriate, and documentation.   HPI: Terri Sosa is a 74 y.o. female referred by Dr. Claiborne Billings for further evaluation of right upper quadrant pain.  The history is obtained through the patient and review of her electronic health record.  She has a history of hypertension, acquired hypothyroidism.  Followed by Dr. Delman Cheadle.  Suburethral sling procedure May 17.  Referred for 1 year history of mild right upper quadrant pain described as a pressure.  Frequently takes her breath. Occurs almost daily, late in the day when she is feeling tired. Responds to Tylenol.  There is associated bloating or tightness as if there "isn't enough room for the liver."  No change in bowel habits.  No NSAIDs. She thinks it may be related to fatty liver.  Chronic constipation, having one bowel movement daily on Colace, flax seed, and Miralax. Intermittent sense of incomplete evacuation. No recent change in bowel habits. Dr. Claiborne Billings told her he was concerned about gallbladder disease.   Significant stress. 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 Symptoms are certainly worse when she overdoes it.  Weight increased since Covid started.  Recent evaluation includes normal CMP except for an ALT of 51 and glucose of 118.  CBC was normal except for hemoglobin of 15.4.  MCV 92.6, RDW 13.3, platelets 253.  Hemoglobin A1c 6.3. Abdominal ultrasound 02/20/2020 showed fatty liver but was negative for gallstones  Prior endoscopy with Dr. Maurene Capes Colonoscopy 2000 showed showed a tubulovillous adenoma in the cecum Colonoscopy 2004 was normal Colonoscopy 02/22/2010 was normal  Upper GI series 11/15/2000 to evaluate positive Hemoccult showed a small sliding hiatal hernia and was otherwise normal  No known family history of colon cancer or polyps. No family history of uterine/endometrial cancer, pancreatic cancer or gastric/stomach cancer.   Past Medical History:  Diagnosis Date  . Colon polyps   . Essential hypertension, benign   . GERD (gastroesophageal reflux disease)   . Hearing loss in left ear   . Hypertension   . Mixed  hyperlipidemia   . Psoriasis    Dr. Delman Cheadle  . Skin cancer   . Thyroid disease     Past Surgical History:  Procedure Laterality Date  . ABDOMINAL HYSTERECTOMY     partial     Current Outpatient Medications  Medication Sig Dispense Refill  . Biotin 2500 MCG CAPS Take by mouth.      . calcium carbonate (OS-CAL) 600 MG TABS Take 600 mg by mouth 2 (two) times daily with a meal.      . cetirizine (ZYRTEC) 10 MG tablet Take 10 mg by mouth daily.    . Cyanocobalamin (VITAMIN B 12 PO) Take 1 tablet by mouth daily.    Marland Kitchen docusate sodium (COLACE) 100 MG capsule Take 100 mg by mouth 2 (two) times daily.    Marland Kitchen estradiol (VIVELLE-DOT) 0.025 MG/24HR Place 0.5 patches onto the skin 2 (two) times a week.      Marland Kitchen Lifitegrast (XIIDRA) 5 % SOLN Apply 1 drop to eye daily.    Marland Kitchen lisinopril-hydrochlorothiazide (PRINZIDE,ZESTORETIC) 20-25 MG per tablet Take 1 tablet by mouth daily.      . Multiple Vitamin (MULTIVITAMIN) tablet Take 1 tablet by mouth daily.    . Omega-3 Fatty Acids (FISH OIL) 1000 MG CAPS Take 1 capsule by mouth daily.    . polyethylene glycol powder (GLYCOLAX/MIRALAX) powder Take 17 g by mouth daily.      . Risankizumab-rzaa,150 MG Dose, 75 MG/0.83ML PSKT Inject into the skin.    . Na Sulfate-K Sulfate-Mg Sulf 17.5-3.13-1.6 GM/177ML SOLN Take 1 kit by mouth as directed. 354 mL 0  . omeprazole (PRILOSEC) 40 MG capsule Take 1 capsule (40 mg total) by mouth daily. 90 capsule 3   No current facility-administered medications for this visit.    Allergies as of 03/25/2020 - Review Complete 03/25/2020  Allergen Reaction Noted  . Amoxicillin-pot clavulanate  01/27/2010  . Azithromycin  03/25/2020  . Codeine  01/27/2010    Family History  Problem Relation Age of Onset  . Arthritis Father   . Asthma Father   . Kidney disease Father   . Cancer Sister   . Heart disease Sister   . Allergies Mother   . Hypertension Mother   . Heart disease Mother   . Arthritis Mother     Social History   Socioeconomic History  . Marital status: Married    Spouse name: Not on file  . Number of children: Not on file  . Years of education: Not on file  . Highest education level:  Not on file  Occupational History  . Not on file  Tobacco Use  . Smoking status: Former Smoker    Packs/day: 1.00    Years: 23.00    Pack years: 23.00    Types: Cigarettes    Quit date: 12/17/1984    Years since quitting: 35.2  Substance and Sexual Activity  . Alcohol use: No  . Drug use: No  . Sexual activity: Not on file  Other Topics Concern  . Not on file  Social History Narrative  . Not on file   Social Determinants of Health   Financial Resource Strain:   . Difficulty of Paying Living Expenses:   Food Insecurity:   . Worried About Charity fundraiser in the Last Year:   . Arboriculturist in the Last Year:   Transportation Needs:   . Film/video editor (Medical):   Marland Kitchen Lack of Transportation (Non-Medical):   Physical Activity:   .  Days of Exercise per Week:   . Minutes of Exercise per Session:   Stress:   . Feeling of Stress :   Social Connections:   . Frequency of Communication with Friends and Family:   . Frequency of Social Gatherings with Friends and Family:   . Attends Religious Services:   . Active Member of Clubs or Organizations:   . Attends Archivist Meetings:   Marland Kitchen Marital Status:   Intimate Partner Violence:   . Fear of Current or Ex-Partner:   . Emotionally Abused:   Marland Kitchen Physically Abused:   . Sexually Abused:     Review of Systems: 12 system ROS is negative except as noted above.   Physical Exam: General:   Alert,  well-nourished, pleasant and cooperative in NAD Head:  Normocephalic and atraumatic. Eyes:  Sclera clear, no icterus.   Conjunctiva Sosa. Ears:  Normal auditory acuity. Nose:  No deformity, discharge,  or lesions. Mouth:  No deformity or lesions.   Neck:  Supple; no masses or thyromegaly. Lungs:  Clear throughout to auscultation.   No wheezes. Heart:  Regular rate and rhythm; no murmurs. Abdomen:  Soft,nontender, nondistended, normal bowel sounds, no rebound or guarding. No hepatosplenomegaly.   Rectal:  Deferred   Msk:  Symmetrical. No boney deformities LAD: No inguinal or umbilical LAD Extremities:  No clubbing or edema. Neurologic:  Alert and  oriented x4;  grossly nonfocal Skin:  Intact without significant lesions or rashes. Psych:  Alert and cooperative. Normal mood and affect.     Yida Hyams L. Tarri Glenn, MD, MPH 03/25/2020, 7:47 PM

## 2020-03-26 ENCOUNTER — Telehealth: Payer: Self-pay | Admitting: Gastroenterology
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
## 2020-03-26 NOTE — Telephone Encounter (Signed)
Pt is confused why she is scheduled for and abd [place] 03/31/20.  She stated that she recently had a [place] and that Dr. Tarri Glenn was aware.

## 2020-03-26 NOTE — Telephone Encounter (Signed)
Left message for patient to call back Patient is scheduled for fibrosure and elastography.  The [place] is to facilitate those tests.  I have not cancelled the [place].  I asked that she call back to discuss before we cancel any appts.

## 2020-03-26 NOTE — Telephone Encounter (Signed)
Left message for patient to call back  

## 2020-03-27 LAB — NASH FIBROSURE
ALPHA 2-MACROGLOBULINS, QN: 291 mg/dL — ABNORMAL HIGH (ref 110–276)
ALT (SGPT) P5P: 46 IU/L — ABNORMAL HIGH (ref 0–40)
AST (SGOT) P5P: 42 IU/L — ABNORMAL HIGH (ref 0–40)
Apolipoprotein A-1: 171 mg/dL (ref 116–209)
Bilirubin, Total: 0.2 mg/dL (ref 0.0–1.2)
Cholesterol, Total: 230 mg/dL — ABNORMAL HIGH (ref 100–199)
Fibrosis Score: 0.25 — ABNORMAL HIGH (ref 0.00–0.21)
GGT: 51 IU/L (ref 0–60)
Glucose: 112 mg/dL — ABNORMAL HIGH (ref 65–99)
Haptoglobin: 170 mg/dL (ref 42–346)
Height: 67 in
NASH Score: 0.75 — ABNORMAL HIGH
Steatosis Score: 0.71 — ABNORMAL HIGH (ref 0.00–0.30)
Triglycerides: 175 mg/dL — ABNORMAL HIGH (ref 0–149)
Weight: 214 [lb_av]

## 2020-03-29 ENCOUNTER — Telehealth: Payer: Self-pay | Admitting: Gastroenterology

## 2020-03-29 NOTE — Telephone Encounter (Signed)
Thornton Park, MD  Algernon Huxley, RN 1 hour ago (9:41 AM)     This ultrasound is different from the one she had recently. This is an ultrasound that checks for liver stiffness as a way to understand if there is any liver damage. We discussed this recommendation during her last office visit as a way to understand liver damage without needing a liver biopsy. Thank you

## 2020-03-29 NOTE — Telephone Encounter (Signed)
This ultrasound is different from the one she had recently. This is an ultrasound that checks for liver stiffness as a way to understand if there is any liver damage. We discussed this recommendation during her last office visit as a way to understand liver damage without needing a liver biopsy. Thank you.

## 2020-03-29 NOTE — Telephone Encounter (Signed)
Left message for patient to call back  

## 2020-03-29 NOTE — Telephone Encounter (Signed)
See other phone note for details from 4/9

## 2020-03-30 NOTE — Telephone Encounter (Signed)
Left message for pt to call back.  Spoke with pt and she is aware and will keep [place] as scheduled.

## 2020-03-31 ENCOUNTER — Other Ambulatory Visit: Payer: Self-pay

## 2020-03-31 ENCOUNTER — Ambulatory Visit (HOSPITAL_COMMUNITY)
Admission: RE | Admit: 2020-03-31 | Discharge: 2020-03-31 | Disposition: A | Discharge status: Home / Self Care | Payer: Medicare Other | Source: Ambulatory Visit | Attending: Gastroenterology | Admitting: Gastroenterology

## 2020-03-31 DIAGNOSIS — K760 Fatty (change of) liver, not elsewhere classified: Secondary | ICD-10-CM | POA: Diagnosis not present

## 2020-03-31 LAB — ANA: Anti Nuclear Antibody (ANA): NEGATIVE

## 2020-03-31 LAB — ANTI-SMOOTH MUSCLE ANTIBODY, IGG: Actin (Smooth Muscle) Antibody (IGG): <20 U (ref <20)

## 2020-03-31 LAB — HEPATITIS C ANTIBODY
Hepatitis C Ab: NONREACTIVE
SIGNAL TO CUT-OFF: 0.01 (ref <1.00)

## 2020-03-31 LAB — IGG: IgG (Immunoglobin G), Serum: 1133 mg/dL (ref 600–1540)

## 2020-03-31 LAB — HEPATITIS B SURFACE ANTIGEN: Hepatitis B Surface Ag: NONREACTIVE

## 2020-03-31 LAB — HEPATITIS B CORE ANTIBODY, TOTAL: Hep B Core Total Ab: NONREACTIVE

## 2020-03-31 LAB — IGM: IgM, Serum: 85 mg/dL (ref 50–300)

## 2020-03-31 LAB — MITOCHONDRIAL ANTIBODIES: Mitochondrial M2 Ab, IgG: <=20 U

## 2020-04-01 ENCOUNTER — Telehealth: Payer: Self-pay | Admitting: Gastroenterology

## 2020-04-01 NOTE — Telephone Encounter (Signed)
Let pt know she should follow a low fat diet.

## 2020-04-01 NOTE — Telephone Encounter (Signed)
Patient returning your call advised patient of Dr. Tarri Glenn result message and patient would like to know if she should be on any specific diet

## 2020-04-14 ENCOUNTER — Ambulatory Visit (AMBULATORY_SURGERY_CENTER): Payer: Medicare Other | Admitting: Gastroenterology

## 2020-04-14 ENCOUNTER — Encounter: Payer: Self-pay | Admitting: Gastroenterology

## 2020-04-14 ENCOUNTER — Other Ambulatory Visit: Payer: Self-pay

## 2020-04-14 VITALS — BP 120/70 | HR 81 | Temp 96.8°F | Resp 21 | Ht 67.0 in | Wt 214.0 lb

## 2020-04-14 DIAGNOSIS — K2950 Unspecified chronic gastritis without bleeding: Secondary | ICD-10-CM

## 2020-04-14 DIAGNOSIS — D124 Benign neoplasm of descending colon: Secondary | ICD-10-CM

## 2020-04-14 DIAGNOSIS — D123 Benign neoplasm of transverse colon: Secondary | ICD-10-CM

## 2020-04-14 DIAGNOSIS — R1011 Right upper quadrant pain: Secondary | ICD-10-CM

## 2020-04-14 DIAGNOSIS — Z86010 Personal history of colonic polyps: Secondary | ICD-10-CM | POA: Diagnosis not present

## 2020-04-14 DIAGNOSIS — D126 Benign neoplasm of colon, unspecified: Secondary | ICD-10-CM

## 2020-04-14 DIAGNOSIS — K449 Diaphragmatic hernia without obstruction or gangrene: Secondary | ICD-10-CM

## 2020-04-14 DIAGNOSIS — K3189 Other diseases of stomach and duodenum: Secondary | ICD-10-CM

## 2020-04-14 DIAGNOSIS — D120 Benign neoplasm of cecum: Secondary | ICD-10-CM

## 2020-04-14 DIAGNOSIS — D122 Benign neoplasm of ascending colon: Secondary | ICD-10-CM

## 2020-04-14 DIAGNOSIS — K760 Fatty (change of) liver, not elsewhere classified: Secondary | ICD-10-CM

## 2020-04-14 MED ORDER — SODIUM CHLORIDE 0.9 % IV SOLN: 500.0000 mL | Freq: Once | INTRAVENOUS | Status: DC

## 2020-04-14 NOTE — Op Note (Addendum)
[place] Patient Name: Terri Sosa Procedure Date: 04/14/2020 8:33 AM MRN: [id number] Endoscopist: Thornton Park MD, MD Age: 74 Referring MD:  Date of Birth: 10-Jan-1946 Gender: Female Account #: [id number] Procedure:                Upper GI endoscopy Indications:              Right upper quadrant pain x1 year                           -No source identified on CMP or abdominal ultrasound Medicines:                Monitored Anesthesia Care Procedure:                Pre-Anesthesia Assessment:                           - Prior to the procedure, a History and Physical                            was performed, and patient medications and                            allergies were reviewed. The patient's tolerance of                            previous anesthesia was also reviewed. The risks                            and benefits of the procedure and the sedation                            options and risks were discussed with the patient.                            All questions were answered, and informed consent                            was obtained. Prior Anticoagulants: The patient has                            taken no previous anticoagulant or antiplatelet                            agents. ASA Grade Assessment: II - A patient with                            mild systemic disease. After reviewing the risks                            and benefits, the patient was deemed in                            satisfactory condition to undergo the procedure.  After obtaining informed consent, the endoscope was                            passed under direct vision. Throughout the                            procedure, the patient's blood pressure, pulse, and                            oxygen saturations were monitored continuously. The                            Endoscope was introduced through the mouth, and                            advanced to the  third part of duodenum. The upper                            GI endoscopy was accomplished without difficulty.                            The patient tolerated the procedure well. Scope In: Scope Out: Findings:                 The Z-line was irregular. Two small islands were                            identified. Biopsies were taken with a cold forceps                            for histology. Estimated blood loss was minimal.                           Diffuse mild inflammation characterized by                            friability and granularity was found in the gastric                            body. Biopsies were taken from the antrum, body,                            and fundus with a cold forceps for histology.                            Estimated blood loss was minimal.                           Small hiatal hernia is present. The examined                            duodenum was normal. Biopsies were taken with a  cold forceps for histology. Estimated blood loss                            was minimal. Complications:            No immediate complications. Estimated blood loss:                            Minimal. Estimated Blood Loss:     Estimated blood loss was minimal. Impression:               - Z-line irregular. Biopsied.                           - Gastritis. Biopsied.                           - Small hiatal hernial.                           - Normal examined duodenum. Biopsied. Recommendation:           - Patient has a contact number available for                            emergencies. The signs and symptoms of potential                            delayed complications were discussed with the                            patient. Return to normal activities tomorrow.                            Written discharge instructions were provided to the                            patient.                           - Resume previous diet.                            - Continue present medications.                           - No aspirin, ibuprofen, naproxen, or other                            non-steroidal anti-inflammatory drugs.                           - Await pathology results. Thornton Park MD, MD 04/14/2020 9:20:25 AM This report has been signed electronically.

## 2020-04-14 NOTE — Op Note (Addendum)
Patient Name: Terri Sosa Procedure Date: 04/14/2020 8:32 AM MRN: [id number] Endoscopist: Thornton Park MD, MD Age: 74 Referring MD:  Date of Birth: 05/01/1946 Gender: Female Account #: [id number] Procedure:                Colonoscopy Indications:              Surveillance: Personal history of adenomatous                            polyps on last colonoscopy > 5 years ago                           -Cecal tubulovillous adenoma 2000                           -Normal colonoscopies 2004 and 2011 Medicines:                Monitored Anesthesia Care Procedure:                Pre-Anesthesia Assessment:                           - Prior to the procedure, a History and Physical                            was performed, and patient medications and                            allergies were reviewed. The patient's tolerance of                            previous anesthesia was also reviewed. The risks                            and benefits of the procedure and the sedation                            options and risks were discussed with the patient.                            All questions were answered, and informed consent                            was obtained. Prior Anticoagulants: The patient has                            taken no previous anticoagulant or antiplatelet                            agents. ASA Grade Assessment: II - A patient with                            mild systemic disease. After reviewing the risks  and benefits, the patient was deemed in                            satisfactory condition to undergo the procedure.                           After obtaining informed consent, the colonoscope                            was passed under direct vision. Throughout the                            procedure, the patient's blood pressure, pulse, and                            oxygen saturations were monitored continuously. The                           Colonoscope was introduced through the anus and                            advanced to the the cecum, identified by                            appendiceal orifice and ileocecal valve. A second                            forward view of the right colon was performed. The                            colonoscopy was performed with moderate difficulty                            due to a redundant colon, significant looping and a                            tortuous colon. Successful completion of the                            procedure was aided by applying abdominal pressure.                            The patient tolerated the procedure well. The                            quality of the bowel preparation was good. The                            ileocecal valve, appendiceal orifice, and rectum                            were photographed. Scope In: 8:48:15 AM Scope Out: 9:09:49 AM Scope Withdrawal Time: 0 hours 14 minutes 9 seconds  Total Procedure Duration: 0 hours  21 minutes 34 seconds  Findings:                 The perianal and digital rectal examinations were                            normal.                           Non-bleeding internal hemorrhoids were found. The                            hemorrhoids were small.                           Seven sessile polyps were found in the descending                            colon (2 polyps), hepatic flexure (2 polyps),                            ascending colon (2 polyps) and cecum (1 polyp). The                            polyps were 1 to 3 mm in size. These polyps were                            removed with a cold snare. Resection and retrieval                            were complete. Estimated blood loss was minimal.                           The exam was otherwise without abnormality on                            direct and retroflexion views. Complications:            No immediate complications. Estimated blood  loss:                            Minimal. Estimated Blood Loss:     Estimated blood loss was minimal. Impression:               - Non-bleeding internal hemorrhoids.                           - Diverticulosis in the sigmoid colon.                           - Seven 1 to 3 mm polyps in the descending colon,                            at the hepatic flexure, in the ascending colon and  in the cecum, removed with a cold snare. Resected                            and retrieved.                           - The examination was otherwise normal on direct                            and retroflexion views. Recommendation:           - Patient has a contact number available for                            emergencies. The signs and symptoms of potential                            delayed complications were discussed with the                            patient. Return to normal activities tomorrow.                            Written discharge instructions were provided to the                            patient.                           - Continue present medications.                           - Await pathology results.                           - Repeat colonoscopy date to be determined after                            pending pathology results are reviewed for                            surveillance.                           - Follow a high fiber diet. Drink at least 64                            ounces of water daily. Add a daily stool bulking                            agent such as psyllium (an exampled would be                            Metamucil).                           -  Emerging evidence supports eating a diet of                            fruits, vegetables, grains, calcium, and yogurt                            while reducing red meat and alcohol may reduce the                            risk of colon cancer.                           - Thank you for allowing  me to be involved in your                            colon cancer prevention. Thornton Park MD, MD 04/14/2020 9:26:01 AM This report has been signed electronically.

## 2020-04-14 NOTE — Progress Notes (Signed)
DT- vitals JB- temp 

## 2020-04-14 NOTE — Progress Notes (Signed)
No problems noted in the recovery room. maw 

## 2020-04-14 NOTE — Progress Notes (Signed)
To PACU VSS. Report to [email or web address] 

## 2020-04-14 NOTE — Progress Notes (Signed)
Called to room to assist during endoscopic procedure.  Patient ID and intended procedure confirmed with present staff. Received instructions for my participation in the procedure from the performing physician.  

## 2020-04-14 NOTE — Patient Instructions (Signed)
YOU HAD AN ENDOSCOPIC PROCEDURE TODAY AT [facility]:   Refer to the procedure report that was given to you for any specific questions about what was found during the examination.  If the procedure report does not answer your questions, please call your gastroenterologist to clarify.  If you requested that your care partner not be given the details of your procedure findings, then the procedure report has been included in a sealed envelope for you to review at your convenience later.  YOU SHOULD EXPECT: Some feelings of bloating in the abdomen. Passage of more gas than usual.  Walking can help get rid of the air that was put into your GI tract during the procedure and reduce the bloating. If you had a lower endoscopy (such as a colonoscopy or flexible sigmoidoscopy) you may notice spotting of blood in your stool or on the toilet paper. If you underwent a bowel prep for your procedure, you may not have a normal bowel movement for a few days.  Please Note:  You might notice some irritation and congestion in your nose or some drainage.  This is from the oxygen used during your procedure.  There is no need for concern and it should clear up in a day or so.  SYMPTOMS TO REPORT IMMEDIATELY:   Following lower endoscopy (colonoscopy or flexible sigmoidoscopy):  Excessive amounts of blood in the stool  Significant tenderness or worsening of abdominal pains  Swelling of the abdomen that is new, acute  Fever of 100F or higher   Following upper endoscopy (EGD)  Vomiting of blood or coffee ground material  New chest pain or pain under the shoulder blades  Painful or persistently difficult swallowing  New shortness of breath  Fever of 100F or higher  Black, tarry-looking stools  For urgent or emergent issues, a gastroenterologist can be reached at any hour by calling [phone]. Do not use MyChart messaging for urgent concerns.    DIET:  We do recommend a small meal at first, but  then you may proceed to your regular diet.  Drink plenty of fluids but you should avoid alcoholic beverages for 24 hours.  ACTIVITY:  You should plan to take it easy for the rest of today and you should NOT DRIVE or use heavy machinery until tomorrow (because of the sedation medicines used during the test).    FOLLOW UP: Our staff will call the number listed on your records 48-72 hours following your procedure to check on you and address any questions or concerns that you may have regarding the information given to you following your procedure. If we do not reach you, we will leave a message.  We will attempt to reach you two times.  During this call, we will ask if you have developed any symptoms of COVID 19. If you develop any symptoms (ie: fever, flu-like symptoms, shortness of breath, cough etc.) before then, please call [phone].  If you test positive for Covid 19 in the 2 weeks post procedure, please call and report this information to [place].    If any biopsies were taken you will be contacted by phone or by letter within the next 1-3 weeks.  Please call us at [phone] if you have not heard about the biopsies in 3 weeks.    SIGNATURES/CONFIDENTIALITY: You and/or your care partner have signed paperwork which will be entered into your electronic medical record.  These signatures attest to the fact that that the information above on  your After Visit Summary has been reviewed and is understood.  Full responsibility of the confidentiality of this discharge information lies with you and/or your care-partner.   YOU HAD AN ENDOSCOPIC PROCEDURE TODAY AT [facility]:   Refer to the procedure report that was given to you for any specific questions about what was found during the examination.  If the procedure report does not answer your questions, please call your gastroenterologist to clarify.  If you requested that your care partner not be given the details of your procedure  findings, then the procedure report has been included in a sealed envelope for you to review at your convenience later.  YOU SHOULD EXPECT: Some feelings of bloating in the abdomen. Passage of more gas than usual.  Walking can help get rid of the air that was put into your GI tract during the procedure and reduce the bloating. If you had a lower endoscopy (such as a colonoscopy or flexible sigmoidoscopy) you may notice spotting of blood in your stool or on the toilet paper. If you underwent a bowel prep for your procedure, you may not have a normal bowel movement for a few days.  Please Note:  You might notice some irritation and congestion in your nose or some drainage.  This is from the oxygen used during your procedure.  There is no need for concern and it should clear up in a day or so.  SYMPTOMS TO REPORT IMMEDIATELY:   Following lower endoscopy (colonoscopy or flexible sigmoidoscopy):  Excessive amounts of blood in the stool  Significant tenderness or worsening of abdominal pains  Swelling of the abdomen that is new, acute  Fever of 100F or higher   Following upper endoscopy (EGD)  Vomiting of blood or coffee ground material  New chest pain or pain under the shoulder blades  Painful or persistently difficult swallowing  New shortness of breath  Fever of 100F or higher  Black, tarry-looking stools  For urgent or emergent issues, a gastroenterologist can be reached at any hour by calling [phone]. Do not use MyChart messaging for urgent concerns.    DIET:  We do recommend a small meal at first, but then you may proceed to your regular diet.  Drink plenty of fluids but you should avoid alcoholic beverages for 24 hours.  ACTIVITY:  You should plan to take it easy for the rest of today and you should NOT DRIVE or use heavy machinery until tomorrow (because of the sedation medicines used during the test).    FOLLOW UP: Our staff will call the number listed on your records  48-72 hours following your procedure to check on you and address any questions or concerns that you may have regarding the information given to you following your procedure. If we do not reach you, we will leave a message.  We will attempt to reach you two times.  During this call, we will ask if you have developed any symptoms of COVID 19. If you develop any symptoms (ie: fever, flu-like symptoms, shortness of breath, cough etc.) before then, please call [phone].  If you test positive for Covid 19 in the 2 weeks post procedure, please call and report this information to [place].    If any biopsies were taken you will be contacted by phone or by letter within the next 1-3 weeks.  Please call us at [phone] if you have not heard about the biopsies in 3 weeks.    SIGNATURES/CONFIDENTIALITY: You  and/or your care partner have signed paperwork which will be entered into your electronic medical record.  These signatures attest to the fact that that the information above on your After Visit Summary has been reviewed and is understood.  Full responsibility of the confidentiality of this discharge information lies with you and/or your care-partner.    Handouts were given to your care partner on Gastritis, Hiatal Hernia,  polyps, diverticulosis, and a high fiber diet with liberal fluid intake. Emerging evidence supports eating a diet of fruits, vegetables, grains, calcium, and yogurt while reducing red meat and alcohol may reduce the risk of colon cancer.  Add a daily stool bulking agent as psyllium example Metamucil. NO aspirin, ibuprofen, naproxen, or other non-steroidal anti-inflammatory drugs. You may resume your other current medications today. Await biopsy results. Please call if any questions or concerns.

## 2020-04-16 ENCOUNTER — Telehealth: Payer: Self-pay

## 2020-04-16 NOTE — Telephone Encounter (Signed)
  Follow up Call-  Call back number 04/14/2020  Post procedure Call Back phone  # [phone]  Permission to leave phone message Yes  Some recent data might be hidden     Patient questions:  Do you have a fever, pain , or abdominal swelling? No. Pain Score  0 *  Have you tolerated food without any problems? Yes.    Have you been able to return to your normal activities? Yes.    Do you have any questions about your discharge instructions: Diet   No. Medications  No. Follow up visit  No.  Do you have questions or concerns about your Care? No.  Actions: * If pain score is 4 or above: No action needed, pain <4.   1. Have you developed a fever since your procedure? No  2.   Have you had an respiratory symptoms (SOB or cough) since your procedure? No  3.   Have you tested positive for COVID 19 since your procedure No  4.   Have you had any family members/close contacts diagnosed with the COVID 19 since your procedure?  No   If yes to any of these questions please route to Joylene John, RN and Erenest Rasher, RN

## 2020-04-19 ENCOUNTER — Encounter: Payer: Self-pay | Admitting: Gastroenterology

## 2020-04-30 ENCOUNTER — Ambulatory Visit: Payer: Medicare Other | Admitting: Gastroenterology

## 2020-04-30 ENCOUNTER — Encounter: Payer: Self-pay | Admitting: Gastroenterology

## 2020-04-30 VITALS — BP 146/72 | HR 70 | Temp 98.2°F | Ht 67.0 in | Wt 211.6 lb

## 2020-04-30 DIAGNOSIS — R1011 Right upper quadrant pain: Secondary | ICD-10-CM | POA: Diagnosis not present

## 2020-04-30 DIAGNOSIS — K760 Fatty (change of) liver, not elsewhere classified: Secondary | ICD-10-CM | POA: Diagnosis not present

## 2020-04-30 DIAGNOSIS — K3189 Other diseases of stomach and duodenum: Secondary | ICD-10-CM | POA: Diagnosis not present

## 2020-04-30 DIAGNOSIS — K31A0 Gastric intestinal metaplasia, unspecified: Secondary | ICD-10-CM

## 2020-04-30 NOTE — Progress Notes (Signed)
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 High probability of being normal History of colon polyps    -Cecal tubulovillous adenoma 2000    -Normal colonoscopies 2004 and 2011    - 7 tubular adenomas removed 2021, surveillance recommended in 3 years Chronic constipation Prediabetes BMI 33.52   RUQ: Differential includes reflux esophagitis and gastritis, as well as gastroparesis, symptomatic gallbladder disesase, and functional dyspepsia. Less likely SIBO. Some improvement with PPI. Will continue therapy as she just increased the dose to twice daily. Could be capsular stretch although less likely as there is no obvious hepatomegaly on review of her ultrasound images.  Intestinal metaplasia on antrum biopsies: Not seen elsewhere. However, it is associated with gastric hypochlorhydria, which may result in small intestinal bacterial overgrowth. General measures to decrease the risk of intestinal metaplasia progression to gastric cancer include not smoking and moderation of alcohol intake.   NASH: Labs and elastography show NASH without significant fibrosis. Reviewed diagnosis and treatment strategies. Discussed cardiovascular risks.  Plan at least annual folow-up.   Personal history of colon polyps:Reviewed pathology results from recent colonoscopy. Surveillance colonoscopy recommended in 3 years.    PLAN: - Continue omeprazole 40 mg BID (refill 30 days with 3 refills) - Avoid NSAIDs - EGD with gastric mapping in 3 years for intestinal metaplasia -  Surveillance colonoscopy in 3 years  - She will call in August for a symptom update - NASH lifestyle recommendations: healthy weight, exercise, eating well, drinking coffee - If not improving, will proceed with HIDA scan with [facility] - Follow-up in 4-6 months, earlier if needed  I spent 45 minutes, including in depth chart review, independent review of results, communicating results with the patient directly, face-to-face time with the patient, coordinating care, ordering studies and medications as appropriate, and documentation.   HPI: Terri Sosa is a 74 y.o. female referred by Dr. Claiborne Billings under evaluation of right upper quadrant pain. She was seen in consultation 03/25/20 and had endoscopy 04/14/20. She returns in scheduled follow-up. The interval history is obtained through the patient and review of her electronic health record.  She has a history of hypertension, acquired hypothyroidism. Suburethral sling procedure May 17.  Referred for 1 year history of mild right upper quadrant pain described as a pressure.  Frequently takes her breath. Occurs almost daily, late in the day when she is feeling tired. Responds to Tylenol.  There is associated bloating or tightness as if there "isn't enough room for the liver."  No change in bowel habits.  No NSAIDs. She thinks it may be related to fatty liver.  Chronic constipation, having one bowel movement daily on Colace, flax seed, and Miralax. Intermittent sense of incomplete evacuation. No recent change in bowel habits. Dr. Claiborne Billings told her he was concerned about gallbladder disease.   Significant stress. 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 Symptoms are certainly worse when she overdoes it.  Weight increased since Covid started. Husband was just diagnosed with diabetes and an arrythmia and there  has been a prolonged weight to see the EP cardiologist. She is very concerned about her husband's risks for acute cardiac event while waiting.     CMP was normal except for an ALT of 51 and glucose of 118.  CBC was normal.  Hemoglobin A1c 6.3. Abdominal ultrasound 02/20/2020 showed fatty liver but was negative for gallstones.  Endoscopic evaluation 04/14/20 showed:  Colonoscopy: - Non-bleeding internal hemorrhoids. - Diverticulosis in the sigmoid colon. - Seven 1 to 3 mm polyps in the descending colon, at the hepatic flexure, in the ascending colon and in the cecum, removed with a cold snare. All were tubular adenomas.  EGD: - Z-line irregular. Biopsies showed reflux esophagitis - Gastritis. 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 - Small hiatal hernial. - Normal examined duodenum. Biopsies showed normal duodenum   Today, she reports a several year history of persistent, nonpainful sensation in the throat that occurs between meals. Feels like it needs to be coughed up. Originally thought this was allergies.  There is no heartburn or regurgitation. There is no pain, lateralization of the symptoms, dysphagia, odynophagia, dysphonia, or weight loss. There is no history of radiation to the head neck, smoking, or alcohol consumption.  No change in symptoms on twice daily dosing omepraole. Continues to have bloating, nausea, early satiety, and epigastric pain and occasionally the RUQ. Not related to eating, defecation, or movement.   NASH FibroSURE 03/25/20:  F0-F1, S3 marked steatosis, N2 NASH Elastography 03/31/20: Diffuse echogenicity of the liver. Showed low probably of liver disease Additional labs obtained in April 2021 showed normal ANA, IgG, AMA, IgM, ASMAb, iron; ferritin was 347.7. HBcAb, HbSAg, and HCV Ab negative.   Prior endoscopy with Dr. Maurene Capes Colonoscopy 2000 showed showed a tubulovillous adenoma in the cecum Colonoscopy 2004 was normal Colonoscopy 02/22/2010 was normal  Upper GI series 11/15/2000 to evaluate positive Hemoccult showed a small sliding hiatal hernia and was otherwise  normal  No known family history of colon cancer or polyps. No family history of uterine/endometrial cancer, pancreatic cancer or gastric/stomach cancer.   Past Medical History:  Diagnosis Date  . Allergy   . Colon polyps   . Essential hypertension, benign   . GERD (gastroesophageal reflux disease)   . Hearing loss in left ear   . Hypertension   . Mixed hyperlipidemia   . Psoriasis    Dr. Delman Cheadle  . Skin cancer   . Thyroid disease     Past Surgical History:  Procedure Laterality Date  . ABDOMINAL HYSTERECTOMY     partial  . PELVIC FLOOR REPAIR  2017    Current Outpatient Medications  Medication Sig Dispense Refill  . Biotin 2500 MCG CAPS Take by mouth.      . calcium carbonate (OS-CAL) 600 MG TABS Take 600 mg by mouth 2 (two) times daily with a meal.      . cetirizine (ZYRTEC) 10 MG tablet Take 10 mg by mouth daily.    . Cyanocobalamin (VITAMIN B 12 PO) Take 1 tablet by mouth daily.    Marland Kitchen docusate sodium (COLACE) 100 MG capsule Take 100 mg by mouth 2 (two) times daily.    Marland Kitchen estradiol (VIVELLE-DOT) 0.025 MG/24HR Place 0.5 patches onto the skin 2 (two) times a week.      . levothyroxine (SYNTHROID) 100 MCG tablet Take 100 mcg by mouth daily before breakfast.    . Lifitegrast (XIIDRA) 5 % SOLN Apply 1 drop to eye daily.    Marland Kitchen lisinopril-hydrochlorothiazide (PRINZIDE,ZESTORETIC) 20-25  MG per tablet Take 1 tablet by mouth daily.      . Multiple Vitamin (MULTIVITAMIN) tablet Take 1 tablet by mouth daily.    . Omega-3 Fatty Acids (FISH OIL) 1000 MG CAPS Take 1 capsule by mouth daily.    Marland Kitchen omeprazole (PRILOSEC) 40 MG capsule Take 1 capsule (40 mg total) by mouth daily. (Patient taking differently: Take 40 mg by mouth in the morning and at bedtime. ) 90 capsule 3  . polyethylene glycol powder (GLYCOLAX/MIRALAX) powder Take 17 g by mouth daily.      . Risankizumab-rzaa,150 MG Dose, 75 MG/0.83ML PSKT Inject into the skin.     No current facility-administered medications for this visit.     Allergies as of 04/30/2020 - Review Complete 04/14/2020  Allergen Reaction Noted  . Amoxicillin-pot clavulanate  01/27/2010  . Azithromycin  03/25/2020  . Codeine  01/27/2010    Family History  Problem Relation Age of Onset  . Arthritis Father   . Asthma Father   . Kidney disease Father   . Cancer Sister   . Heart disease Sister   . Allergies Mother   . Hypertension Mother   . Heart disease Mother   . Arthritis Mother     Social History   Socioeconomic History  . Marital status: Married    Spouse name: Not on file  . Number of children: Not on file  . Years of education: Not on file  . Highest education level: Not on file  Occupational History  . Not on file  Tobacco Use  . Smoking status: Former Smoker    Packs/day: 1.00    Years: 23.00    Pack years: 23.00    Types: Cigarettes    Quit date: 12/17/1984    Years since quitting: 35.3  . Smokeless tobacco: Never Used  Substance and Sexual Activity  . Alcohol use: No  . Drug use: No  . Sexual activity: Not on file  Other Topics Concern  . Not on file  Social History Narrative  . Not on file   Social Determinants of Health   Financial Resource Strain:   . Difficulty of Paying Living Expenses:   Food Insecurity:   . Worried About Charity fundraiser in the Last Year:   . Arboriculturist in the Last Year:   Transportation Needs:   . Film/video editor (Medical):   Marland Kitchen Lack of Transportation (Non-Medical):   Physical Activity:   . Days of Exercise per Week:   . Minutes of Exercise per Session:   Stress:   . Feeling of Stress :   Social Connections:   . Frequency of Communication with Friends and Family:   . Frequency of Social Gatherings with Friends and Family:   . Attends Religious Services:   . Active Member of Clubs or Organizations:   . Attends Archivist Meetings:   Marland Kitchen Marital Status:   Intimate Partner Violence:   . Fear of Current or Ex-Partner:   . Emotionally Abused:   Marland Kitchen  Physically Abused:   . Sexually Abused:      Physical Exam: General:   Alert,  well-nourished, pleasant and cooperative in NAD Head:  Normocephalic and atraumatic. Eyes:  Sclera clear, no icterus.   Conjunctiva pink. Ears:  Normal auditory acuity. Nose:  No deformity, discharge,  or lesions. Mouth:  No deformity or lesions.   Neck:  Supple; no masses or thyromegaly. Lungs:  Clear throughout to auscultation.   No  wheezes. Heart:  Regular rate and rhythm; no murmurs. Abdomen:  Soft,nontender, nondistended, normal bowel sounds, no rebound or guarding. No hepatosplenomegaly.   Rectal:  Deferred  Msk:  Symmetrical. No boney deformities LAD: No inguinal or umbilical LAD Extremities:  No clubbing or edema. Neurologic:  Alert and  oriented x4;  grossly nonfocal Skin:  Intact without significant lesions or rashes. Psych:  Alert and cooperative. Normal mood and affect.     Consetta Cosner L. Tarri Glenn, MD, MPH 04/30/2020, 9:00 AM

## 2020-04-30 NOTE — Patient Instructions (Addendum)
Please take your omeprazole 40 mg twice daily. I am hopeful that over the next few weeks to months this will improve your pain.  Please call me in August if you aren't feeling better so that we can proceed with some testing of your gallbladder prior to your next appointment with me.  We will plan an EGD and colonoscopy in 3 years.  Your ultrasound and lab results show non-alcoholic steatohepatitis (fatty liver). Thankfully, there is no significant damage. Unfortunately, there are no FDA-approved medications for fatty liver disease.   The good news is that the most effective treatment so far for fatty liver disease does not involve medications, but rather lifestyle changes. The bad news is that these are typically hard to achieve and maintain for many people. Here's what we know helps:  - Lose weight. Weight loss of roughly 5% of your body weight might be enough to improve abnormal liver tests and decrease the fat in the liver. Losing between 7% and 10% of body weight seems to decrease the amount of inflammation and injury to liver cells, and it may even reverse some of the damage of fibrosis. Target a gradual weight loss of 1 to 2 pounds per week, as very rapid weight loss may worsen inflammation and fibrosis. You may want to explore the option of weight loss surgery with your doctor, if you aren't making any headway with weight loss and your health is suffering. - It appears that aerobic exercise also leads to decreased fat in the liver, and with vigorous intensity, possibly also decreased inflammation independent of weight loss. - Eat well. Some studies suggest that the Mediterranean diet may also decrease the fat in the liver. This nutrition plan emphasizes fruits, vegetables, whole grains, legumes, nuts, replacing butter with olive or canola oil, limiting red meat, and eating more fish and lean poultry. Avoid high fructose corn syrup.  - Drink coffee, maybe? Some studies showed that patients with NAFLD  who drank coffee (about two cups every day) had a decreased risk in fibrosis. However, take into consideration the downsides of regular caffeine intake.  Even though it can be difficult to make these lifestyle changes and lose the weight, the benefit is immense if you have fatty liver, so give it your best effort! And remember, the greatest risk for people with a fatty liver is still cardiovascular disease. Not only can some of these lifestyle changes improve or resolve your fatty liver, they will also help keep your heart healthy.  I hope that things go well for your husband and his appointment with cardiologist. You will both be in my thoughts.  Thanks for putting your trust in me. Please call with any additional questions or concerns before your next appointment.

## 2020-05-18 ENCOUNTER — Telehealth: Payer: Self-pay | Admitting: Gastroenterology

## 2020-05-18 MED ORDER — OMEPRAZOLE 40 MG PO CPDR: 40.0000 mg | DELAYED_RELEASE_CAPSULE | Freq: Two times a day (BID) | ORAL | 0 refills | Status: DC

## 2020-05-18 NOTE — Telephone Encounter (Signed)
Informed patient that the prescription for omeprazole was sent but to [facility] instead. Informed patient I will resend the prescription to [facility]. Patient verbalized understanding.

## 2020-05-18 NOTE — Telephone Encounter (Signed)
Patient is calling requesting refill for omeprazole. She states that Dr. Tarri Glenn changed her to 1 a day to 2 a day and was told it would be called in. She states that they did not have it yet. She is requesting for it to be sent to [facility] for a 90 day supply.

## 2020-07-05 ENCOUNTER — Telehealth: Payer: Self-pay | Admitting: Gastroenterology

## 2020-07-05 NOTE — Telephone Encounter (Signed)
Patient called states she is taking Omeprazole but is now getting diarrhea wondering if she needs to decrease medication

## 2020-07-06 NOTE — Telephone Encounter (Signed)
She should reduce to once daily or switch to pantoprazole 40 mg QAM. Her choice. Thank you.

## 2020-07-06 NOTE — Telephone Encounter (Signed)
Pt states she has been taking omeprazole 40mg  BID. States since it was increased to bid she has been having diarrhea. Pt thinks the omeprazole has caused this. She wants to know if Dr. Tarri Glenn may prescribe something different. Let pt know that Dr. Tarri Glenn is out of the office today but will return tomorrow and we will get back with her regarding Dr. Tarri Glenn' recommendation. Please advise.

## 2020-07-07 NOTE — Telephone Encounter (Signed)
Spoke with pt and she will try the omeprazole once a day and see how she does. Pt knows to call back if she continues to have problems.

## 2020-07-19 ENCOUNTER — Other Ambulatory Visit: Payer: Self-pay | Admitting: Gastroenterology

## 2020-10-19 ENCOUNTER — Telehealth: Payer: Self-pay | Admitting: Cardiovascular Disease

## 2020-10-19 NOTE — Telephone Encounter (Signed)
Returned call to pt she states that her cough has almost subsided, she has not ever had any fever, she does not have SOB and she thinks that she will be all better by the appt time. She states that she does not think that she has COVID. She will call back to cancel if sx worsen.

## 2020-10-19 NOTE — Telephone Encounter (Signed)
Patient has a New Patient appt on Friday 11/05, she states that she has had a cold for a couple of weeks. She says that she is feeling better but still has a slight cough. She wants to know if it is still OK for her to come in.

## 2020-10-22 ENCOUNTER — Ambulatory Visit (INDEPENDENT_AMBULATORY_CARE_PROVIDER_SITE_OTHER): Payer: Medicare Other | Admitting: Cardiovascular Disease

## 2020-10-22 ENCOUNTER — Encounter: Payer: Self-pay | Admitting: Cardiovascular Disease

## 2020-10-22 ENCOUNTER — Other Ambulatory Visit: Payer: Self-pay

## 2020-10-22 VITALS — BP 142/68 | HR 78 | Ht 67.0 in | Wt 203.0 lb

## 2020-10-22 DIAGNOSIS — R011 Cardiac murmur, unspecified: Secondary | ICD-10-CM | POA: Diagnosis not present

## 2020-10-22 DIAGNOSIS — E782 Mixed hyperlipidemia: Secondary | ICD-10-CM | POA: Diagnosis not present

## 2020-10-22 DIAGNOSIS — I10 Essential (primary) hypertension: Secondary | ICD-10-CM

## 2020-10-22 NOTE — Patient Instructions (Signed)
Medication Instructions:  Your physician recommends that you continue on your current medications as directed. Please refer to the Current Medication list given to you today.  *If you need a refill on your cardiac medications before your next appointment, please call your pharmacy*  Testing/Procedures: Your physician has requested that you have an echocardiogram. Echocardiography is a painless test that uses sound waves to create images of your heart. It provides your doctor with information about the size and shape of your heart and how well your heart's chambers and valves are working. This procedure takes approximately one hour. There are no restrictions for this procedure. [place]. [address].    Follow-Up: At [facility], you and your health needs are our priority.  As part of our continuing mission to provide you with exceptional heart care, we have created designated Provider Care Teams.  These Care Teams include your primary Cardiologist (physician) and Advanced Practice Providers (APPs -  Physician Assistants and Nurse Practitioners) who all work together to provide you with the care you need, when you need it.  We recommend signing up for the patient portal called "MyChart".  Sign up information is provided on this After Visit Summary.  MyChart is used to connect with patients for Virtual Visits (Telemedicine).  Patients are able to view lab/test results, encounter notes, upcoming appointments, etc.  Non-urgent messages can be sent to your provider as well.   To learn more about what you can do with MyChart, go to [email or web address].    Provider:   You may see Dr. Adora Fridge as needed.

## 2020-10-22 NOTE — Assessment & Plan Note (Signed)
History of cardiac murmur recently auscultated by her PCP, Dr. Reymundo Poll,.  She was referred here for further evaluation.  I cannot auscultate 1 on exam today however.  We will get a 2D echo to further evaluate

## 2020-10-22 NOTE — Assessment & Plan Note (Signed)
History of hyperlipidemia with lipid profile performed by her PCP 09/03/2020 June until cholesterol 255, LDL of 172 HDL 45.  She was begun on rosuvastatin at that time followed by her PCP.

## 2020-10-22 NOTE — Assessment & Plan Note (Signed)
History of essential potential blood pressure measured today at 142/68.  She is on lisinopril/hydrochlorothiazide.

## 2020-10-22 NOTE — Progress Notes (Signed)
10/22/2020 Terri Sosa   [birth year]  [id number]  Primary Physician Delilah Shan, MD Primary Cardiologist: Lorretta Harp MD Garret Reddish, [place], [place]  HPI:  Terri Sosa is a 74 y.o. moderately overweight married Caucasian female whose husband Shanon Brow is also a patient of mine.  She is a mother of 2, grandmother for grandchildren.  She is retired from being an Glass blower/designer at [employer].  She was referred by her PCP, Dr. Reymundo Poll, for evaluation of an auscultated murmur.  Risk factors include treated hypertension hyperlipidemia.  Is no family history of heart disease.  She is never had a heart attack or stroke.  She denies chest pain or shortness of breath.  Recent lipid profile performed by her PCP 09/03/2020 revealed total cholesterol 255, LDL 172 and HDL of 45 for which she was begun on rosuvastatin.  Apparently her PCP auscultated a murmur on exam and referred here for further evaluation.   Current Meds  Medication Sig  . Biotin 2500 MCG CAPS Take by mouth.    . calcium carbonate (OS-CAL) 600 MG TABS Take 600 mg by mouth 2 (two) times daily with a meal.    . cetirizine (ZYRTEC) 10 MG tablet Take 10 mg by mouth daily.  . Cyanocobalamin (VITAMIN B 12 PO) Take 1 tablet by mouth daily.  Marland Kitchen docusate sodium (COLACE) 100 MG capsule Take 100 mg by mouth 2 (two) times daily.  Marland Kitchen estradiol (VIVELLE-DOT) 0.025 MG/24HR Place 0.5 patches onto the skin 2 (two) times a week.    . levothyroxine (SYNTHROID) 100 MCG tablet Take 100 mcg by mouth daily before breakfast.  . Lifitegrast (XIIDRA) 5 % SOLN Apply 1 drop to eye daily.  Marland Kitchen lisinopril-hydrochlorothiazide (PRINZIDE,ZESTORETIC) 20-25 MG per tablet Take 1 tablet by mouth daily.    . Multiple Vitamin (MULTIVITAMIN) tablet Take 1 tablet by mouth daily.  . Omega-3 Fatty Acids (FISH OIL) 1000 MG CAPS Take 1 capsule by mouth daily.  Marland Kitchen omeprazole (PRILOSEC) 40 MG capsule TAKE 1 CAPSULE BY MOUTH IN  THE MORNING AND AT BEDTIME  . polyethylene glycol  powder (GLYCOLAX/MIRALAX) powder Take 17 g by mouth daily.    . Risankizumab-rzaa,150 MG Dose, 75 MG/0.83ML PSKT Inject into the skin.  . rosuvastatin (CRESTOR) 10 MG tablet Take 10 mg by mouth at bedtime.     Allergies  Allergen Reactions  . Amoxicillin-Pot Clavulanate     REACTION: nausea/vomiting  . Azithromycin   . Codeine     REACTION: nausea/vomiting    Social History   Socioeconomic History  . Marital status: Married    Spouse name: Not on file  . Number of children: Not on file  . Years of education: Not on file  . Highest education level: Not on file  Occupational History  . Not on file  Tobacco Use  . Smoking status: Former Smoker    Packs/day: 1.00    Years: 23.00    Pack years: 23.00    Types: Cigarettes    Quit date: 12/17/1984    Years since quitting: 35.8  . Smokeless tobacco: Never Used  Substance and Sexual Activity  . Alcohol use: No  . Drug use: No  . Sexual activity: Not on file  Other Topics Concern  . Not on file  Social History Narrative  . Not on file   Social Determinants of Health   Financial Resource Strain:   . Difficulty of Paying Living Expenses: Not on file  Food Insecurity:   .  Worried About Charity fundraiser in the Last Year: Not on file  . Ran Out of Food in the Last Year: Not on file  Transportation Needs:   . Lack of Transportation (Medical): Not on file  . Lack of Transportation (Non-Medical): Not on file  Physical Activity:   . Days of Exercise per Week: Not on file  . Minutes of Exercise per Session: Not on file  Stress:   . Feeling of Stress : Not on file  Social Connections:   . Frequency of Communication with Friends and Family: Not on file  . Frequency of Social Gatherings with Friends and Family: Not on file  . Attends Religious Services: Not on file  . Active Member of Clubs or Organizations: Not on file  . Attends Archivist Meetings: Not on file  . Marital Status: Not on file  Intimate Partner  Violence:   . Fear of Current or Ex-Partner: Not on file  . Emotionally Abused: Not on file  . Physically Abused: Not on file  . Sexually Abused: Not on file     Review of Systems: General: negative for chills, fever, night sweats or weight changes.  Cardiovascular: negative for chest pain, dyspnea on exertion, edema, orthopnea, palpitations, paroxysmal nocturnal dyspnea or shortness of breath Dermatological: negative for rash Respiratory: negative for cough or wheezing Urologic: negative for hematuria Abdominal: negative for nausea, vomiting, diarrhea, bright red blood per rectum, melena, or hematemesis Neurologic: negative for visual changes, syncope, or dizziness All other systems reviewed and are otherwise negative except as noted above.    Blood pressure (!) 142/68, pulse 78, height 5\' 7"  (1.702 m), weight 203 lb (92.1 kg), SpO2 99 %.  General appearance: alert and no distress Neck: no adenopathy, no carotid bruit, no JVD, supple, symmetrical, trachea midline and thyroid not enlarged, symmetric, no tenderness/mass/nodules Lungs: clear to auscultation bilaterally Heart: regular rate and rhythm, S1, S2 normal, no murmur, click, rub or gallop Extremities: extremities normal, atraumatic, no cyanosis or edema Pulses: 2+ and symmetric Skin: Skin color, texture, turgor normal. No rashes or lesions Neurologic: Alert and oriented X 3, normal strength and tone. Normal symmetric reflexes. Normal coordination and gait  EKG sinus rhythm at 78 with septal Q waves.  I personally reviewed this EKG.  ASSESSMENT AND PLAN:   Essential hypertension, benign History of essential potential blood pressure measured today at 142/68.  She is on lisinopril/hydrochlorothiazide.  Mixed hyperlipidemia History of hyperlipidemia with lipid profile performed by her PCP 09/03/2020 June until cholesterol 255, LDL of 172 HDL 45.  She was begun on rosuvastatin at that time followed by her PCP.  Cardiac  murmur History of cardiac murmur recently auscultated by her PCP, Dr. Reymundo Poll,.  She was referred here for further evaluation.  I cannot auscultate 1 on exam today however.  We will get a 2D echo to further evaluate      Lorretta Harp MD [facility], [facility] 10/22/2020 10:15 AM

## 2020-11-15 ENCOUNTER — Other Ambulatory Visit: Payer: Self-pay

## 2020-11-15 ENCOUNTER — Ambulatory Visit (HOSPITAL_COMMUNITY): Payer: Medicare Other | Attending: Cardiology

## 2020-11-15 DIAGNOSIS — R011 Cardiac murmur, unspecified: Secondary | ICD-10-CM | POA: Diagnosis not present

## 2020-11-15 LAB — ECHOCARDIOGRAM COMPLETE
Area-P 1/2: 2.73 cm2
S' Lateral: 2.4 cm

## 2021-06-11 ENCOUNTER — Other Ambulatory Visit: Payer: Self-pay | Admitting: Gastroenterology

## 2022-01-21 IMAGING — US US LIVER ELASTOGRAPHY
1 series · 12 of 25 positions shown · non-contrast
Comparison: 02/20/2020 from [REDACTED].

CLINICAL DATA: Right upper quadrant pain and fatty liver.

EXAM:
US LIVER ELASTOGRAPHY
TECHNIQUE: Sonography of the liver was performed. In addition, ultrasound
elastography evaluation of the liver was performed. A region of
interest was placed within the right lobe of the liver. Following
application of a compressive sonographic pulse, tissue
compressibility was assessed. Multiple assessments were performed at
the selected site. Median tissue compressibility was determined.
Previously, hepatic stiffness was assessed by shear wave velocity.
Based on recently published Society of Radiologists in Ultrasound
consensus article, reporting is now recommended to be performed in
the SI units of pressure (kiloPascals) representing hepatic
stiffness/elasticity. The obtained result is compared to the
published reference standards. (cACLD = compensated Advanced Chronic
Liver Disease)

[Series 1: us liver elastography · 12 of 51 slices shown]
[im 3/51]
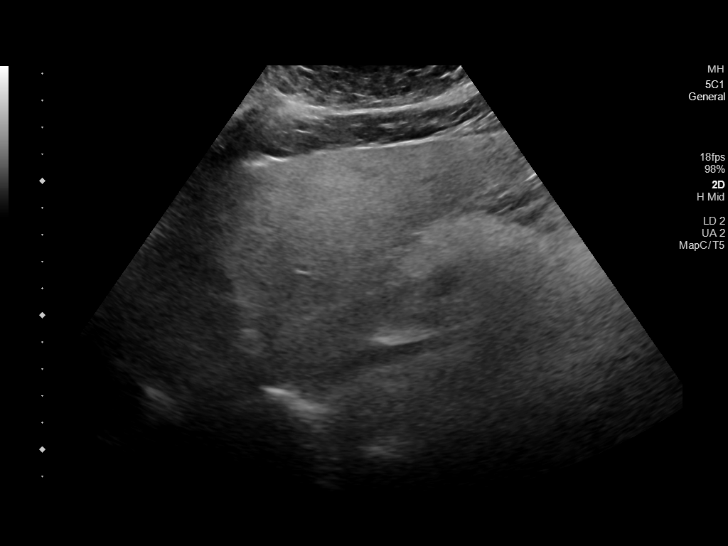
[im 7/51]
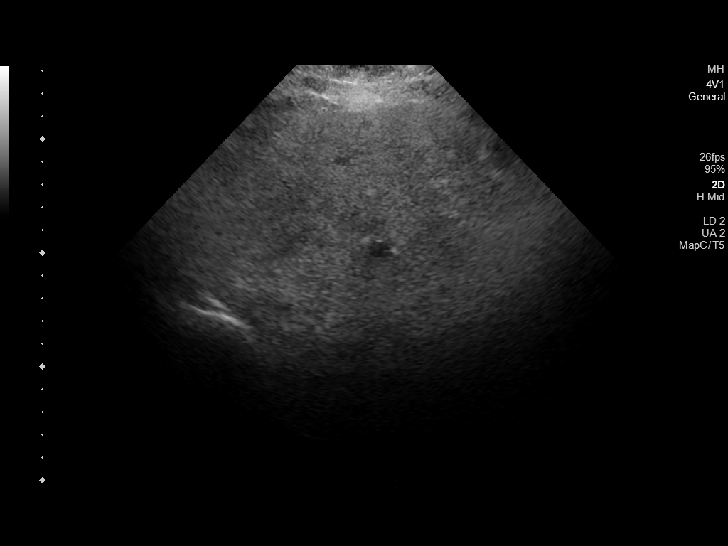
[im 11/51]
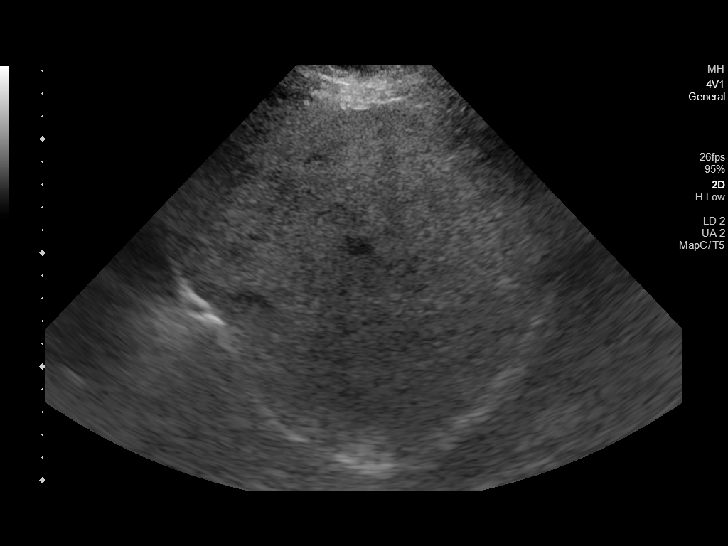
[im 15/51]
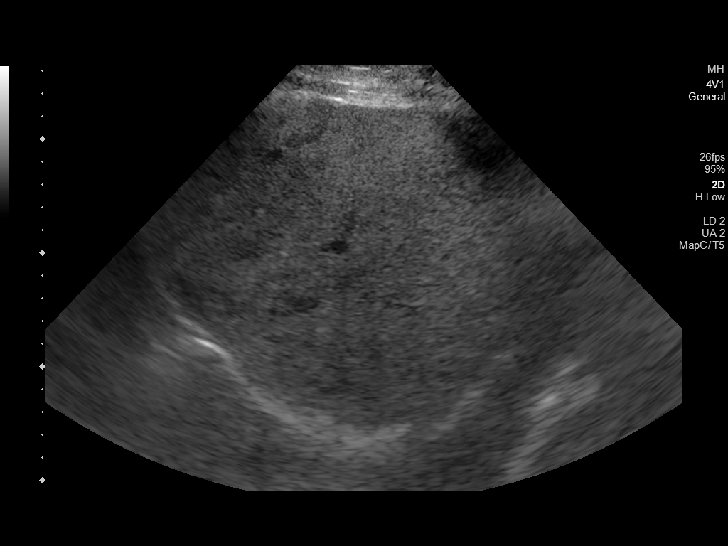
[im 19/51]
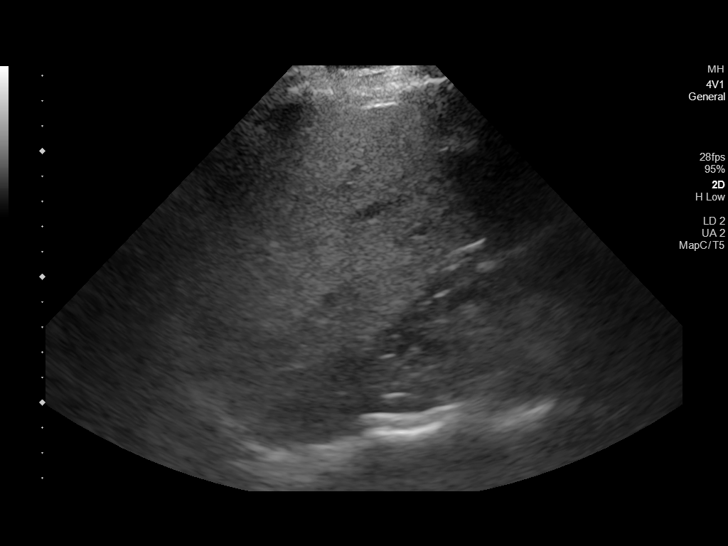
[im 23/51]
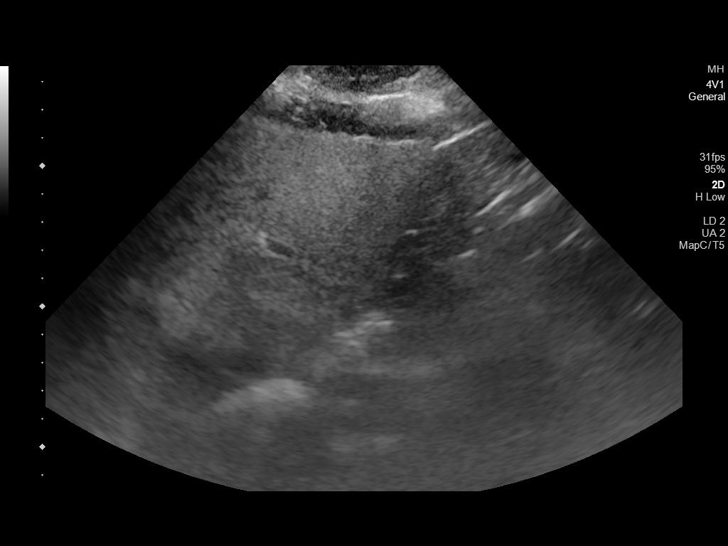
[im 28/51]
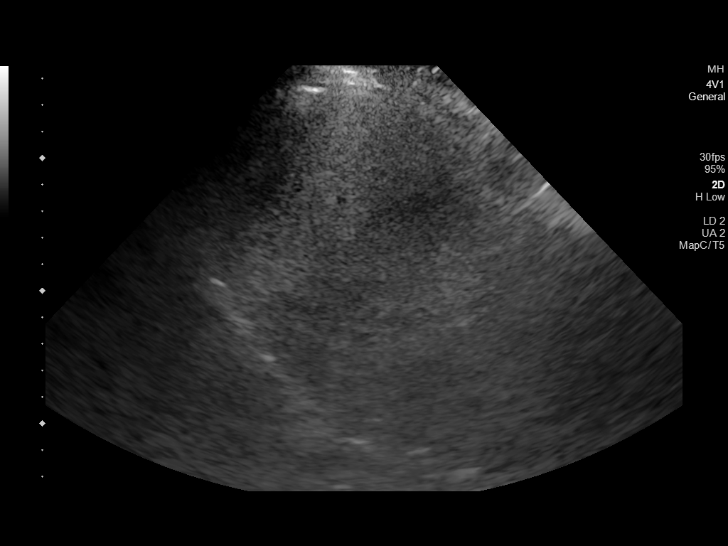
[im 32/51]
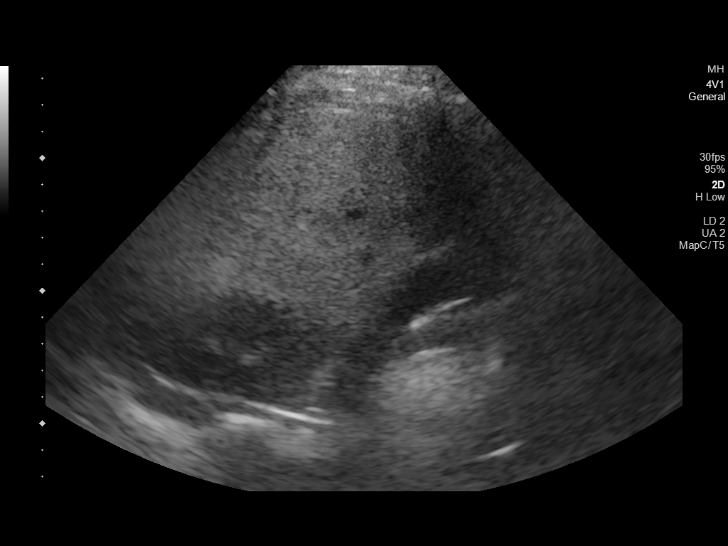
[im 36/51]
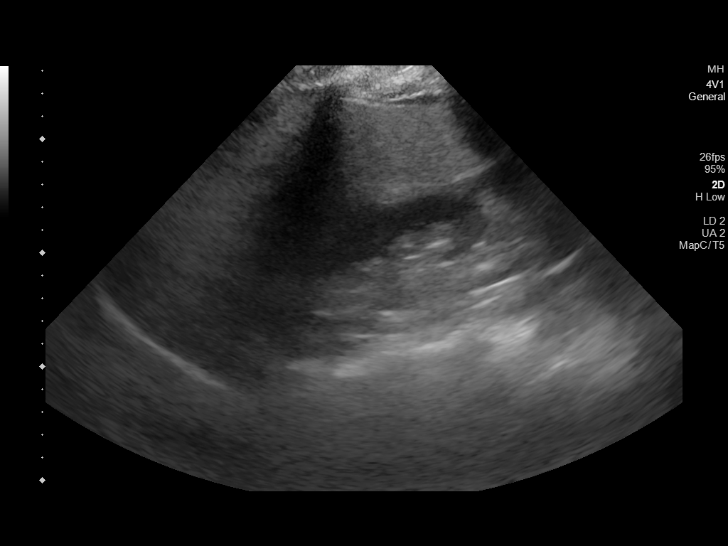
[im 40/51]
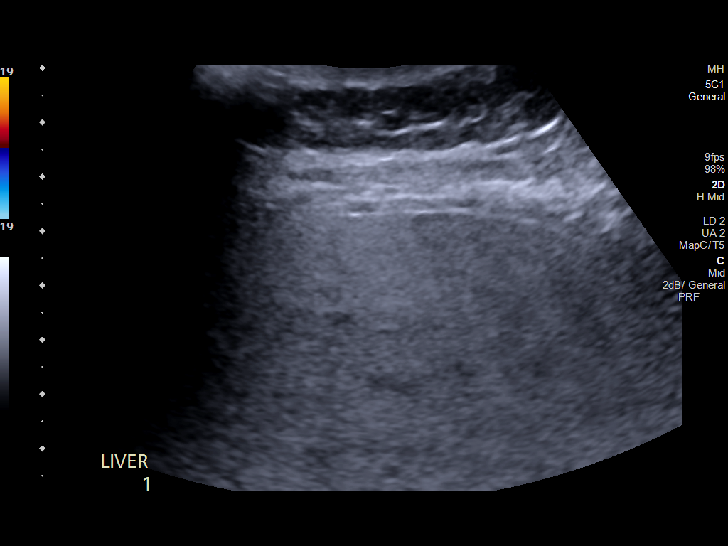
[im 44/51]
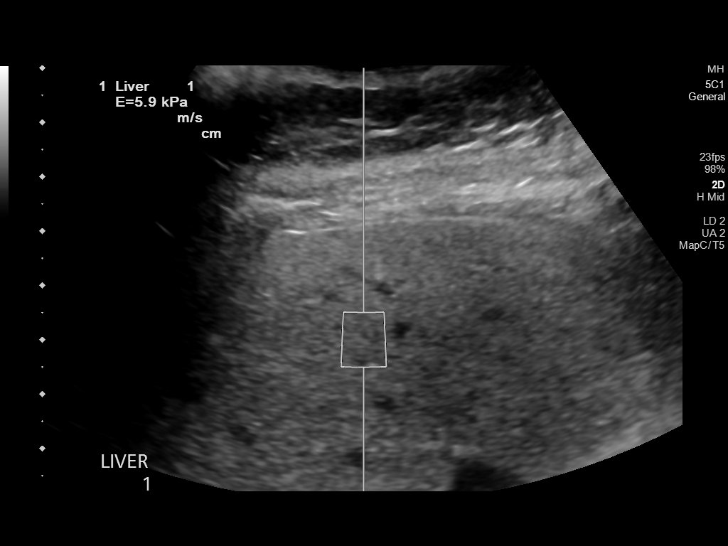
[im 48/51]
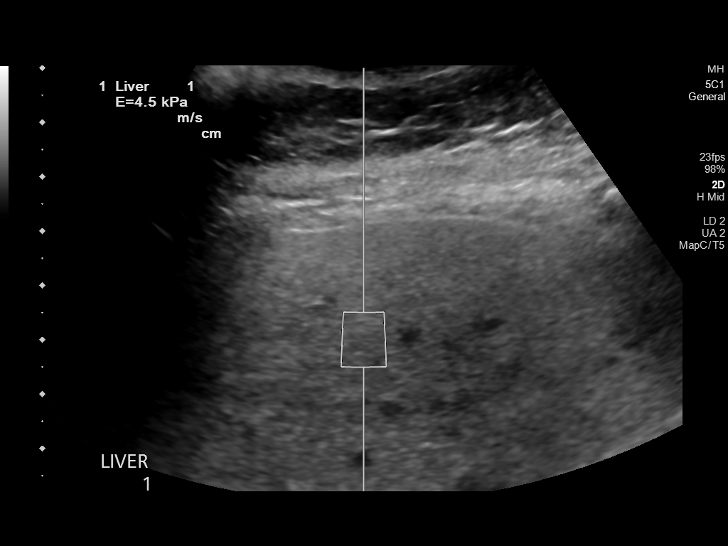

[12 of 25 positions shown; findings below may reference images not displayed]

FINDINGS: Liver: No focal lesion identified. Diffuse moderate to marked
increased echogenicity. Portal vein is patent on color Doppler
imaging with normal direction of blood flow towards the liver.

ULTRASOUND HEPATIC ELASTOGRAPHY

Device: Siemens Helix VTQ

Patient position: Oblique

Transducer: [id number]

Number of measurements: 10

Hepatic segment:  8

Median kPa:

IQR:

IQR/Median kPa ratio:

Data quality:  Good

Diagnostic category:  < or = 5 kPa: high probability of being normal
IMPRESSION: ULTRASOUND LIVER:

Moderately increased echogenicity, consistent with hepatic
steatosis.

ULTRASOUND HEPATIC ELASTOGRAPHY:

Median kPa:

Diagnostic category:  < or = 5 kPa: high probability of being normal

The use of hepatic elastography is applicable to patients with viral
hepatitis and non-alcoholic fatty liver disease. At this time, there
is insufficient data for the referenced cut-off values and use in
other causes of liver disease, including alcoholic liver disease.
Patients, however, may be assessed by elastography and serve as
their own reference standard/baseline.

In patients with non-alcoholic liver disease, the values suggesting
compensated advanced chronic liver disease (cACLD) may be lower, and
patients may need additional testing with elasticity results of [DATE]
kPa.

Please note that abnormal hepatic elasticity and shear wave
velocities may also be identified in clinical settings other than
with hepatic fibrosis, such as: acute hepatitis, elevated right
heart and central venous pressures including use of beta blockers,
Argjendi disease (Castkeylz), infiltrative processes such as
mastocytosis/amyloidosis/infiltrative tumor/lymphoma, extrahepatic
cholestasis, with hyperemia in the post-prandial state, and with
liver transplantation. Correlation with patient history, laboratory
data, and clinical condition recommended.

Diagnostic Categories:

< or =5 kPa: high probability of being normal

< or =9 kPa: in the absence of other known clinical signs, rules [DATE] kPa and ?13 kPa: suggestive of cACLD, but needs further testing

>13 kPa: highly suggestive of cACLD

> or =17 kPa: highly suggestive of cACLD with an increased
probability of clinically significant portal hypertension

## 2022-09-15 DIAGNOSIS — H90A31 Mixed conductive and sensorineural hearing loss, unilateral, right ear with restricted hearing on the contralateral side: Secondary | ICD-10-CM | POA: Insufficient documentation

## 2023-02-07 ENCOUNTER — Encounter: Payer: Self-pay | Admitting: Gastroenterology

## 2023-05-31 ENCOUNTER — Other Ambulatory Visit: Payer: Self-pay

## 2023-05-31 ENCOUNTER — Ambulatory Visit
Admission: EM | Admit: 2023-05-31 | Discharge: 2023-05-31 | Disposition: A | Discharge status: Other Acute Inpatient Hospital | Payer: Medicare Other | Attending: Family Medicine | Admitting: Family Medicine

## 2023-05-31 ENCOUNTER — Encounter (HOSPITAL_COMMUNITY): Payer: Self-pay

## 2023-05-31 ENCOUNTER — Observation Stay (HOSPITAL_COMMUNITY)
Admission: EM | Admit: 2023-05-31 | Discharge: 2023-06-01 | Disposition: A | Discharge status: Home / Self Care | Payer: Medicare Other | Attending: Internal Medicine | Admitting: Internal Medicine

## 2023-05-31 ENCOUNTER — Encounter: Payer: Self-pay | Admitting: *Deleted

## 2023-05-31 ENCOUNTER — Emergency Department (HOSPITAL_COMMUNITY): Payer: Medicare Other

## 2023-05-31 DIAGNOSIS — T783XXA Angioneurotic edema, initial encounter: Secondary | ICD-10-CM | POA: Insufficient documentation

## 2023-05-31 DIAGNOSIS — I10 Essential (primary) hypertension: Secondary | ICD-10-CM | POA: Diagnosis not present

## 2023-05-31 DIAGNOSIS — Z85828 Personal history of other malignant neoplasm of skin: Secondary | ICD-10-CM | POA: Diagnosis not present

## 2023-05-31 DIAGNOSIS — Z79899 Other long term (current) drug therapy: Secondary | ICD-10-CM | POA: Diagnosis not present

## 2023-05-31 DIAGNOSIS — T782XXA Anaphylactic shock, unspecified, initial encounter: Secondary | ICD-10-CM | POA: Diagnosis not present

## 2023-05-31 DIAGNOSIS — T7840XA Allergy, unspecified, initial encounter: Secondary | ICD-10-CM | POA: Diagnosis present

## 2023-05-31 DIAGNOSIS — Z87891 Personal history of nicotine dependence: Secondary | ICD-10-CM | POA: Insufficient documentation

## 2023-05-31 DIAGNOSIS — D696 Thrombocytopenia, unspecified: Secondary | ICD-10-CM

## 2023-05-31 DIAGNOSIS — Z7982 Long term (current) use of aspirin: Secondary | ICD-10-CM | POA: Insufficient documentation

## 2023-05-31 DIAGNOSIS — E876 Hypokalemia: Secondary | ICD-10-CM | POA: Diagnosis not present

## 2023-05-31 LAB — CBC
HCT: 22.1 % — ABNORMAL LOW (ref 36.0–46.0)
Hemoglobin: 7 g/dL — ABNORMAL LOW (ref 12.0–15.0)
MCH: 30.7 pg (ref 26.0–34.0)
MCHC: 31.7 g/dL (ref 30.0–36.0)
MCV: 96.9 fL (ref 80.0–100.0)
Platelets: 137 10*3/uL — ABNORMAL LOW (ref 150–400)
RBC: 2.28 MIL/uL — ABNORMAL LOW (ref 3.87–5.11)
RDW: 12.5 % (ref 11.5–15.5)
WBC: 11.6 10*3/uL — ABNORMAL HIGH (ref 4.0–10.5)
nRBC: 0 % (ref 0.0–0.2)

## 2023-05-31 LAB — BASIC METABOLIC PANEL
Anion gap: 13 (ref 5–15)
BUN: 19 mg/dL (ref 8–23)
CO2: 22 mmol/L (ref 22–32)
Calcium: 8.7 mg/dL — ABNORMAL LOW (ref 8.9–10.3)
Chloride: 105 mmol/L (ref 98–111)
Creatinine, Ser: 0.78 mg/dL (ref 0.44–1.00)
GFR, Estimated: >60 mL/min (ref >60)
Glucose, Bld: 185 mg/dL — ABNORMAL HIGH (ref 70–99)
Potassium: 3.1 mmol/L — ABNORMAL LOW (ref 3.5–5.1)
Sodium: 140 mmol/L (ref 135–145)

## 2023-05-31 LAB — HEMOGLOBIN AND HEMATOCRIT, BLOOD
HCT: 42 % (ref 36.0–46.0)
Hemoglobin: 13.9 g/dL (ref 12.0–15.0)

## 2023-05-31 LAB — POC OCCULT BLOOD, ED: Fecal Occult Bld: NEGATIVE

## 2023-05-31 MED ORDER — SODIUM CHLORIDE 0.9 % IV SOLN: Freq: Once | INTRAVENOUS | Status: DC

## 2023-05-31 MED ORDER — ACETAMINOPHEN 650 MG RE SUPP: 650.0000 mg | Freq: Four times a day (QID) | RECTAL | Status: DC | PRN

## 2023-05-31 MED ORDER — DIPHENHYDRAMINE HCL 50 MG/ML IJ SOLN
25.0000 mg | Freq: Once | INTRAMUSCULAR | Status: AC
  Administered 2023-05-31: 25 mg via INTRAVENOUS
  Filled 2023-05-31: qty 1

## 2023-05-31 MED ORDER — POTASSIUM CHLORIDE 20 MEQ PO PACK
40.0000 meq | PACK | Freq: Once | ORAL | Status: AC
  Administered 2023-06-01: 40 meq via ORAL
  Filled 2023-05-31: qty 2

## 2023-05-31 MED ORDER — SODIUM CHLORIDE 0.9 % IV BOLUS
1000.0000 mL | Freq: Once | INTRAVENOUS | Status: AC
  Administered 2023-05-31: 1000 mL via INTRAVENOUS

## 2023-05-31 MED ORDER — DIPHENHYDRAMINE HCL 50 MG/ML IJ SOLN: 25.0000 mg | Freq: Four times a day (QID) | INTRAMUSCULAR | Status: DC | PRN

## 2023-05-31 MED ORDER — ASPIRIN 81 MG PO TBEC
81.0000 mg | DELAYED_RELEASE_TABLET | Freq: Every day | ORAL | Status: DC
  Administered 2023-06-01: 81 mg via ORAL
  Filled 2023-05-31: qty 1

## 2023-05-31 MED ORDER — ONDANSETRON HCL 4 MG PO TABS: 4.0000 mg | ORAL_TABLET | Freq: Four times a day (QID) | ORAL | Status: DC | PRN

## 2023-05-31 MED ORDER — IPRATROPIUM-ALBUTEROL 0.5-2.5 (3) MG/3ML IN SOLN
3.0000 mL | Freq: Once | RESPIRATORY_TRACT | Status: AC
  Administered 2023-05-31: 3 mL via RESPIRATORY_TRACT
  Filled 2023-05-31: qty 3

## 2023-05-31 MED ORDER — SODIUM CHLORIDE 0.9 % IV SOLN: INTRAVENOUS | Status: DC

## 2023-05-31 MED ORDER — HYDROCHLOROTHIAZIDE 25 MG PO TABS
25.0000 mg | ORAL_TABLET | Freq: Every day | ORAL | Status: DC
  Administered 2023-06-01: 25 mg via ORAL
  Filled 2023-05-31: qty 1

## 2023-05-31 MED ORDER — SODIUM CHLORIDE 0.9 % IV SOLN: INTRAVENOUS | Status: AC

## 2023-05-31 MED ORDER — ENOXAPARIN SODIUM 40 MG/0.4ML IJ SOSY
40.0000 mg | PREFILLED_SYRINGE | INTRAMUSCULAR | Status: DC
  Filled 2023-05-31: qty 0.4

## 2023-05-31 MED ORDER — LIFITEGRAST 5 % OP SOLN: 1.0000 [drp] | Freq: Every day | OPHTHALMIC | Status: DC

## 2023-05-31 MED ORDER — FAMOTIDINE 20 MG PO TABS: 20.0000 mg | ORAL_TABLET | Freq: Once | ORAL | Status: DC

## 2023-05-31 MED ORDER — EPINEPHRINE 0.3 MG/0.3ML IJ SOAJ: 0.3000 mg | Freq: Once | INTRAMUSCULAR | Status: DC | PRN

## 2023-05-31 MED ORDER — FAMOTIDINE IN NACL 20-0.9 MG/50ML-% IV SOLN
20.0000 mg | Freq: Once | INTRAVENOUS | Status: AC
  Administered 2023-05-31: 20 mg via INTRAVENOUS
  Filled 2023-05-31: qty 50

## 2023-05-31 MED ORDER — ONDANSETRON HCL 4 MG/2ML IJ SOLN: 4.0000 mg | Freq: Four times a day (QID) | INTRAMUSCULAR | Status: DC | PRN

## 2023-05-31 MED ORDER — LOSARTAN POTASSIUM 50 MG PO TABS
50.0000 mg | ORAL_TABLET | Freq: Every day | ORAL | Status: DC
  Administered 2023-06-01: 50 mg via ORAL
  Filled 2023-05-31: qty 1

## 2023-05-31 MED ORDER — ACETAMINOPHEN 325 MG PO TABS
650.0000 mg | ORAL_TABLET | Freq: Four times a day (QID) | ORAL | Status: DC | PRN
  Administered 2023-06-01 (×2): 650 mg via ORAL
  Filled 2023-05-31 (×2): qty 2

## 2023-05-31 MED ORDER — ROSUVASTATIN CALCIUM 10 MG PO TABS
10.0000 mg | ORAL_TABLET | Freq: Every day | ORAL | Status: DC
  Administered 2023-06-01: 10 mg via ORAL
  Filled 2023-05-31: qty 1

## 2023-05-31 MED ORDER — IPRATROPIUM-ALBUTEROL 0.5-2.5 (3) MG/3ML IN SOLN: 3.0000 mL | RESPIRATORY_TRACT | Status: DC | PRN

## 2023-05-31 MED ORDER — SODIUM CHLORIDE 0.9% FLUSH
3.0000 mL | Freq: Two times a day (BID) | INTRAVENOUS | Status: DC
  Administered 2023-05-31 – 2023-06-01 (×2): 3 mL via INTRAVENOUS

## 2023-05-31 MED ORDER — LEVOTHYROXINE SODIUM 100 MCG PO TABS
100.0000 ug | ORAL_TABLET | Freq: Every day | ORAL | Status: DC
  Administered 2023-06-01: 100 ug via ORAL
  Filled 2023-05-31: qty 1

## 2023-05-31 MED ORDER — DEXAMETHASONE SODIUM PHOSPHATE 10 MG/ML IJ SOLN
20.0000 mg | Freq: Once | INTRAMUSCULAR | Status: AC
  Administered 2023-05-31: 20 mg

## 2023-05-31 NOTE — ED Provider Notes (Signed)
[facility]    CSN: [id number] Arrival date & time: 05/31/23  1637      History   Chief Complaint Chief Complaint  Patient presents with   Angioedema    HPI Terri Sosa is a 77 y.o. female.   HPI  Past Medical History:  Diagnosis Date   Allergy    Colon polyps    Essential hypertension, benign    GERD (gastroesophageal reflux disease)    Hearing loss in left ear    Hypertension    Mixed hyperlipidemia    Psoriasis    Dr. Emily Filbert   Skin cancer    Thyroid disease     Patient Active Problem List   Diagnosis Date Noted   Cardiac murmur 10/22/2020   Essential hypertension, benign 10/06/2011   Mixed hyperlipidemia 10/06/2011   GERD (gastroesophageal reflux disease) 10/06/2011   Psoriasis    Hearing loss in left ear     Past Surgical History:  Procedure Laterality Date   ABDOMINAL HYSTERECTOMY     partial   PELVIC FLOOR REPAIR  2017    OB History   No obstetric history on file.      Home Medications    Prior to Admission medications   Medication Sig Start Date End Date Taking? Authorizing Provider  Biotin 2500 MCG CAPS Take by mouth.     Yes [provider]  calcium carbonate (OS-CAL) 600 MG TABS Take 600 mg by mouth 2 (two) times daily with a meal.     Yes [provider]  cetirizine (ZYRTEC) 10 MG tablet Take 10 mg by mouth daily.   Yes [provider]  Cyanocobalamin (VITAMIN B 12 PO) Take 1 tablet by mouth daily.   Yes [provider]  docusate sodium (COLACE) 100 MG capsule Take 100 mg by mouth 2 (two) times daily.   Yes [provider]  estradiol (VIVELLE-DOT) 0.025 MG/24HR Place 0.5 patches onto the skin 2 (two) times a week.     Yes [provider]  levothyroxine (SYNTHROID) 100 MCG tablet Take 100 mcg by mouth daily before breakfast.   Yes [provider]  Lifitegrast (XIIDRA) 5 % SOLN Apply 1 drop to eye daily.   Yes [provider]   lisinopril-hydrochlorothiazide (PRINZIDE,ZESTORETIC) 20-25 MG per tablet Take 1 tablet by mouth daily.     Yes [provider]  Omega-3 Fatty Acids (FISH OIL) 1000 MG CAPS Take 1 capsule by mouth daily.   Yes [provider]  omeprazole (PRILOSEC) 40 MG capsule TAKE 1 CAPSULE BY MOUTH IN  THE MORNING AND AT BEDTIME 07/19/20  Yes Beavers, Cala Bradford, MD  polyethylene glycol powder (GLYCOLAX/MIRALAX) powder Take 17 g by mouth daily.     Yes [provider]  rosuvastatin (CRESTOR) 10 MG tablet Take 10 mg by mouth at bedtime. 09/10/20  Yes [provider]  Multiple Vitamin (MULTIVITAMIN) tablet Take 1 tablet by mouth daily.    [provider]  Risankizumab-rzaa,150 MG Dose, 75 MG/0.83ML PSKT Inject into the skin.    [provider]    Family History Family History  Problem Relation Age of Onset   Arthritis Father    Asthma Father    Kidney disease Father    Cancer Sister    Heart disease Sister    Allergies Mother    Hypertension Mother    Heart disease Mother    Arthritis Mother     Social History Social History   Tobacco Use  Smoking status: Former    Packs/day: 1.00    Years: 23.00    Additional pack years: 0.00    Total pack years: 23.00    Types: Cigarettes    Quit date: 12/17/1984    Years since quitting: 38.4   Smokeless tobacco: Never  Substance Use Topics   Alcohol use: No   Drug use: No     Allergies   Amoxicillin-pot clavulanate, Azithromycin, and Codeine   Review of Systems Review of Systems   Physical Exam Triage Vital Signs ED Triage Vitals  Enc Vitals Group     BP 05/31/23 1650 (!) 181/79     Pulse Rate 05/31/23 1650 83     Resp 05/31/23 1650 (!) 21     Temp 05/31/23 1650 97.9 F (36.6 C)     Temp src --      SpO2 05/31/23 1650 95 %     Weight --      Height --      Head Circumference --      Peak Flow --      Pain Score 05/31/23 1653 3     Pain Loc --      Pain Edu? --      Excl. in GC?  --    No data found.  Updated Vital Signs BP (!) 181/79   Pulse 83   Temp 97.9 F (36.6 C)   Resp (!) 21   SpO2 95%   Visual Acuity Right Eye Distance:   Left Eye Distance:   Bilateral Distance:    Right Eye Near:   Left Eye Near:    Bilateral Near:     Physical Exam   UC Treatments / Results  Labs (all labs ordered are listed, but only abnormal results are displayed) Labs Reviewed - No data to display  EKG   Radiology No results found.  Procedures Procedures (including critical care time)  Medications Ordered in UC Medications - No data to display  Initial Impression / Assessment and Plan / UC Course  I have reviewed the triage vital signs and the nursing notes.  Pertinent labs & imaging results that were available during my care of the patient were reviewed by me and considered in my medical decision making (see chart for details).    Tongue swelling Final Clinical Impressions(s) / UC Diagnoses   Final diagnoses:  None   Discharge Instructions   None    ED Prescriptions   None    PDMP not reviewed this encounter.   Thad Ranger [place], [place] 06/04/23 [phone]

## 2023-05-31 NOTE — ED Triage Notes (Signed)
Patient reports tongue swelling and throat swelling with weakness and itching. Patient seen at urgent care. Patient states she had a tick on 5/17. Patient got 2 of epi  (0.3mg  & 0.5mg ). Patient given 20 mg of decadron at urgent care

## 2023-05-31 NOTE — ED Provider Notes (Signed)
[facility]    CSN: [id number] Arrival date & time: 05/31/23  1637      History   Chief Complaint Chief Complaint  Patient presents with   Angioedema    HPI Terri Sosa is a 77 y.o. female.   HPI Here for swelling of her tongue and feeling that her throat is closing.  It began suddenly and she took 2 Benadryl about an hour prior to arrival here.  She is on lisinopril HCT and she had eaten some pistachios earlier this afternoon.  Initially blood pressure was 181/79.  Heart rate was 83     Past Medical History:  Diagnosis Date   Allergy    Colon polyps    Essential hypertension, benign    GERD (gastroesophageal reflux disease)    Hearing loss in left ear    Hypertension    Mixed hyperlipidemia    Psoriasis    Dr. Emily Filbert   Skin cancer    Thyroid disease     Patient Active Problem List   Diagnosis Date Noted   Cardiac murmur 10/22/2020   Essential hypertension, benign 10/06/2011   Mixed hyperlipidemia 10/06/2011   GERD (gastroesophageal reflux disease) 10/06/2011   Psoriasis    Hearing loss in left ear     Past Surgical History:  Procedure Laterality Date   ABDOMINAL HYSTERECTOMY     partial   PELVIC FLOOR REPAIR  2017    OB History   No obstetric history on file.      Home Medications    Prior to Admission medications   Medication Sig Start Date End Date Taking? Authorizing Provider  Biotin 2500 MCG CAPS Take by mouth.     Yes [provider]  calcium carbonate (OS-CAL) 600 MG TABS Take 600 mg by mouth 2 (two) times daily with a meal.     Yes [provider]  cetirizine (ZYRTEC) 10 MG tablet Take 10 mg by mouth daily.   Yes [provider]  Cyanocobalamin (VITAMIN B 12 PO) Take 1 tablet by mouth daily.   Yes [provider]  docusate sodium (COLACE) 100 MG capsule Take 100 mg by mouth 2 (two) times daily.   Yes [provider]  estradiol (VIVELLE-DOT) 0.025 MG/24HR Place 0.5 patches  onto the skin 2 (two) times a week.     Yes [provider]  levothyroxine (SYNTHROID) 100 MCG tablet Take 100 mcg by mouth daily before breakfast.   Yes [provider]  Lifitegrast (XIIDRA) 5 % SOLN Apply 1 drop to eye daily.   Yes [provider]  lisinopril-hydrochlorothiazide (PRINZIDE,ZESTORETIC) 20-25 MG per tablet Take 1 tablet by mouth daily.     Yes [provider]  Omega-3 Fatty Acids (FISH OIL) 1000 MG CAPS Take 1 capsule by mouth daily.   Yes [provider]  omeprazole (PRILOSEC) 40 MG capsule TAKE 1 CAPSULE BY MOUTH IN  THE MORNING AND AT BEDTIME 07/19/20  Yes Beavers, Cala Bradford, MD  polyethylene glycol powder (GLYCOLAX/MIRALAX) powder Take 17 g by mouth daily.     Yes [provider]  rosuvastatin (CRESTOR) 10 MG tablet Take 10 mg by mouth at bedtime. 09/10/20  Yes [provider]  Multiple Vitamin (MULTIVITAMIN) tablet Take 1 tablet by mouth daily.    [provider]  Risankizumab-rzaa,150 MG Dose, 75 MG/0.83ML PSKT Inject into the skin.    [provider]    Family History Family History  Problem Relation Age of Onset   Arthritis  Father    Asthma Father    Kidney disease Father    Cancer Sister    Heart disease Sister    Allergies Mother    Hypertension Mother    Heart disease Mother    Arthritis Mother     Social History Social History   Tobacco Use   Smoking status: Former    Packs/day: 1.00    Years: 23.00    Additional pack years: 0.00    Total pack years: 23.00    Types: Cigarettes    Quit date: 12/17/1984    Years since quitting: 38.4   Smokeless tobacco: Never  Substance Use Topics   Alcohol use: No   Drug use: No     Allergies   Amoxicillin-pot clavulanate, Azithromycin, and Codeine   Review of Systems Review of Systems   Physical Exam Triage Vital Signs ED Triage Vitals  Enc Vitals Group     BP 05/31/23 1650 (!) 181/79     Pulse Rate 05/31/23 1650 83      Resp 05/31/23 1650 (!) 21     Temp 05/31/23 1650 97.9 F (36.6 C)     Temp src --      SpO2 05/31/23 1650 95 %     Weight --      Height --      Head Circumference --      Peak Flow --      Pain Score 05/31/23 1653 3     Pain Loc --      Pain Edu? --      Excl. in GC? --    No data found.  Updated Vital Signs BP 107/70 (BP Location: Right Arm)   Pulse 83   Temp 97.9 F (36.6 C)   Resp (!) 21   SpO2 95%   Visual Acuity Right Eye Distance:   Left Eye Distance:   Bilateral Distance:    Right Eye Near:   Left Eye Near:    Bilateral Near:     Physical Exam Vitals reviewed.  Constitutional:      General: She is not in acute distress.    Appearance: She is not ill-appearing, toxic-appearing or diaphoretic.  HENT:     Mouth/Throat:     Mouth: Mucous membranes are moist.     Comments: Tongue appears a little swollen.  The lips appear normal.  Oropharynx is benign on exam Eyes:     Extraocular Movements: Extraocular movements intact.     Conjunctiva/sclera: Conjunctivae normal.     Pupils: Pupils are equal, round, and reactive to light.  Neck:     Comments: There is firm swelling on either side of her jaw on the submandibular area that is firm.  It is nontender Cardiovascular:     Rate and Rhythm: Normal rate and regular rhythm.     Heart sounds: No murmur heard. Pulmonary:     Effort: Pulmonary effort is normal. No respiratory distress.     Breath sounds: No stridor. No wheezing, rhonchi or rales.     Comments: On exam and on reexam there is no stridor and no wheezing. Musculoskeletal:     Cervical back: Neck supple.  Skin:    Coloration: Skin is not jaundiced or pale.  Neurological:     General: No focal deficit present.     Mental Status: She is alert and oriented to person, place, and time.  Psychiatric:        Behavior: Behavior normal.  UC Treatments / Results  Labs (all labs ordered are listed, but only abnormal results are displayed) Labs  Reviewed - No data to display  EKG   Radiology No results found.  Procedures Procedures (including critical care time)  Medications Ordered in UC Medications  famotidine (PEPCID) tablet 20 mg (has no administration in time range)  0.9 %  sodium chloride infusion (has no administration in time range)  dexamethasone (DECADRON) injection 20 mg (20 mg Other Given 05/31/23 1707)    Initial Impression / Assessment and Plan / UC Course  I have reviewed the triage vital signs and the nursing notes.  Pertinent labs & imaging results that were available during my care of the patient were reviewed by me and considered in my medical decision making (see chart for details).        She was given an injection of Decadron 20 mg.  Within about 5 minutes she states she felt dizzy.  We reclined her further, and repeat blood pressure was 107/70.  She then felt like it was harder to breathe lying down and we set her back up.  Since her blood pressure was soft and her symptoms are progressing.  We decided to go ahead and call EMS for transport to the emergency room.  IV and IV fluids are being started.  Pepcid p.o. is going to be administered Final Clinical Impressions(s) / UC Diagnoses   Final diagnoses:  Angioedema, initial encounter     Discharge Instructions      Patient will be transported to the emergency room for urgent evaluation and treatment     ED Prescriptions   None    PDMP not reviewed this encounter.   Zenia Resides, MD 05/31/23 [phone]

## 2023-05-31 NOTE — ED Notes (Signed)
ED TO INPATIENT HANDOFF REPORT  ED Nurse Name and Phone #: Deon Pilling [phone]  S Name/Age/Gender Terri Sosa 77 y.o. female Room/Bed: WA21/WA21  Code Status   Code Status: DNR  Home/SNF/Other Home Patient oriented to: self, place, time, and situation Is this baseline? Yes   Triage Complete: Triage complete  Chief Complaint Anaphylaxis [T78.2XXA]  Triage Note Patient reports tongue swelling and throat swelling with weakness and itching. Patient seen at urgent care. Patient states she had a tick on 5/17. Patient got 2 of epi  (0.3mg  & 0.5mg ). Patient given 20 mg of decadron at urgent care    Allergies Allergies  Allergen Reactions   Amoxicillin-Pot Clavulanate     REACTION: nausea/vomiting   Azithromycin    Codeine     REACTION: nausea/vomiting    Level of Care/Admitting Diagnosis ED Disposition     ED Disposition  Admit   Condition  --   Comment  Hospital Area: [facility]  HOSPITAL [[id number]]  Level of Care: Progressive [102]  Admit to Progressive based on following criteria: MULTISYSTEM THREATS such as stable sepsis, metabolic/electrolyte imbalance with or without encephalopathy that is responding to early treatment.  May place patient in observation at [facility] or [facility] if equivalent level of care is available:: Yes  Covid Evaluation: Asymptomatic - no recent exposure (last 10 days) testing not required  Diagnosis: Anaphylaxis [190363]  Admitting Physician: Verdene Lennert [[id number]]  Attending Physician: Verdene Lennert [[id number]]          B Medical/Surgery History Past Medical History:  Diagnosis Date   Allergy    Colon polyps    Essential hypertension, benign    GERD (gastroesophageal reflux disease)    Hearing loss in left ear    Hypertension    Mixed hyperlipidemia    Psoriasis    Dr. Emily Filbert   Skin cancer    Thyroid disease    Past Surgical History:  Procedure Laterality Date   ABDOMINAL HYSTERECTOMY     partial   PELVIC  FLOOR REPAIR  2017     A IV Location/Drains/Wounds Patient Lines/Drains/Airways Status     Active Line/Drains/Airways     Name Placement date Placement time Site Days   Peripheral IV 05/31/23 22 G Anterior;Right Forearm 05/31/23  1733  Forearm  less than 1   Peripheral IV 05/31/23 22 G 1.16" Right Antecubital 05/31/23  1853  Antecubital  less than 1            Intake/Output Last 24 hours No intake or output data in the 24 hours ending 05/31/23 2329  Labs/Imaging Results for orders placed or performed during the hospital encounter of 05/31/23 (from the past 48 hour(s))  CBC     Status: Abnormal   Collection Time: 05/31/23  6:53 PM  Result Value Ref Range   WBC 11.6 (H) 4.0 - 10.5 K/uL   RBC 2.28 (L) 3.87 - 5.11 MIL/uL   Hemoglobin 7.0 (L) 12.0 - 15.0 g/dL   HCT 52.8 (L) 41.3 - 24.4 %   MCV 96.9 80.0 - 100.0 fL   MCH 30.7 26.0 - 34.0 pg   MCHC 31.7 30.0 - 36.0 g/dL   RDW 01.0 27.2 - 53.6 %   Platelets 137 (L) 150 - 400 K/uL   nRBC 0.0 0.0 - 0.2 %    Comment: Performed at [facility], [address], [place], [place] [address]  Hemoglobin and hematocrit, blood     Status: None   Collection Time: 05/31/23  8:42 PM  Result Value Ref Range   Hemoglobin 13.9 12.0 - 15.0 g/dL    Comment: SPOKE WITH RN ABOUT DISCREPANCY WITH PREVIOUS HGB   HCT 42.0 36.0 - 46.0 %    Comment: Performed at [facility], [address], [place], [place] [address]  Basic metabolic panel     Status: Abnormal   Collection Time: 05/31/23  8:42 PM  Result Value Ref Range   Sodium 140 135 - 145 mmol/L   Potassium 3.1 (L) 3.5 - 5.1 mmol/L   Chloride 105 98 - 111 mmol/L   CO2 22 22 - 32 mmol/L   Glucose, Bld 185 (H) 70 - 99 mg/dL    Comment: Glucose reference range applies only to samples taken after fasting for at least 8 hours.   BUN 19 8 - 23 mg/dL   Creatinine, Ser 6.04 0.44 - 1.00 mg/dL   Calcium 8.7 (L) 8.9 - 10.3 mg/dL   GFR, Estimated >54 >09 mL/min     Comment: (NOTE) Calculated using the CKD-EPI Creatinine Equation (2021)    Anion gap 13 5 - 15    Comment: Performed at [facility], [address] [place], [place] [address]  POC occult blood, ED Provider will collect     Status: None   Collection Time: 05/31/23 10:09 PM  Result Value Ref Range   Fecal Occult Bld NEGATIVE NEGATIVE   DG Chest Port 1 View  Result Date: 05/31/2023 CLINICAL DATA:  Shortness of breath EXAM: PORTABLE CHEST 1 VIEW COMPARISON:  11/06/2007 FINDINGS: Cardiac size is within normal limits. There are no signs of pulmonary edema or focal pulmonary consolidation. There is no pleural effusion or pneumothorax. IMPRESSION: No active cardiopulmonary disease. Electronically Signed   By: Ernie Avena M.D.   On: 05/31/2023 19:13    Pending Labs Unresulted Labs (From admission, onward)     Start     Ordered   06/01/23 0500  CBC  Tomorrow morning,   R        05/31/23 2258   06/01/23 0500  Basic metabolic panel  Tomorrow morning,   R        05/31/23 2258            Vitals/Pain Today's Vitals   05/31/23 1823 05/31/23 1831 05/31/23 1832 05/31/23 2215  BP: (!) 171/86 (!) 166/85  137/77  Pulse: 94 94  93  Resp: 17 20  (!) 22  Temp: 98.1 F (36.7 C) 98.1 F (36.7 C)    TempSrc: Oral Oral    SpO2: 98% 99%  94%  Weight:   89.8 kg   Height:   5\' 7"  (1.702 m)   PainSc:   0-No pain     Isolation Precautions No active isolations  Medications Medications  enoxaparin (LOVENOX) injection 40 mg (has no administration in time range)  sodium chloride flush (NS) 0.9 % injection 3 mL (has no administration in time range)  acetaminophen (TYLENOL) tablet 650 mg (has no administration in time range)    Or  acetaminophen (TYLENOL) suppository 650 mg (has no administration in time range)  ondansetron (ZOFRAN) tablet 4 mg (has no administration in time range)    Or  ondansetron (ZOFRAN) injection 4 mg (has no administration in time range)   diphenhydrAMINE (BENADRYL) injection 25 mg (has no administration in time range)  ipratropium-albuterol (DUONEB) 0.5-2.5 (3) MG/3ML nebulizer solution 3 mL (has no administration in time range)  EPINEPHrine (EPI-PEN) injection 0.3 mg (has no administration  in time range)  sodium chloride 0.9 % bolus 1,000 mL (0 mLs Intravenous Stopped 05/31/23 2020)    And  0.9 %  sodium chloride infusion (has no administration in time range)  potassium chloride (KLOR-CON) packet 40 mEq (has no administration in time range)  aspirin EC tablet 81 mg (has no administration in time range)  levothyroxine (SYNTHROID) tablet 100 mcg (has no administration in time range)  Lifitegrast 5 % SOLN 1 drop (has no administration in time range)  rosuvastatin (CRESTOR) tablet 10 mg (has no administration in time range)  losartan (COZAAR) tablet 50 mg (has no administration in time range)    And  hydrochlorothiazide (HYDRODIURIL) tablet 25 mg (has no administration in time range)  diphenhydrAMINE (BENADRYL) injection 25 mg (25 mg Intravenous Given 05/31/23 1854)  famotidine (PEPCID) IVPB 20 mg premix (0 mg Intravenous Stopped 05/31/23 2020)  ipratropium-albuterol (DUONEB) 0.5-2.5 (3) MG/3ML nebulizer solution 3 mL (3 mLs Nebulization Given 05/31/23 2020)    Mobility walks     Focused Assessments    R Recommendations: See Admitting Provider Note  Report given to:   Additional Notes:

## 2023-05-31 NOTE — ED Notes (Signed)
EMS at bedside

## 2023-05-31 NOTE — Discharge Instructions (Addendum)
Patient will be transported to the emergency room for urgent evaluation and treatment

## 2023-05-31 NOTE — Assessment & Plan Note (Addendum)
Patient presented with tongue and throat swelling, itching, dizziness and subsequent decrease in blood pressure and wheezing.  Symptoms have all improved but not completely resolved.  Unclear etiology at this time  - S/p Decadron 20 mg IV once, 2 doses of IM epi, Benadryl IV, Pepcid IV and DuoNeb - Continue Benadryl as needed for hives or itching - Continue DuoNebs as needed for wheezing - Epinephrine as needed for worsening tongue/neck swelling - Frequent vital checks with telemetry monitoring overnight - Recommend patient follow-up with allergist

## 2023-05-31 NOTE — Assessment & Plan Note (Addendum)
Although unlikely to be contributing, will discontinue home lisinopril recommend transitioning to losartan on discharge  - Hold home antihypertensives for now - Discontinue home lisinopril-HCTZ and start losartan-HCTZ tomorrow if BP remains stable.

## 2023-05-31 NOTE — Assessment & Plan Note (Signed)
Potentially just a lab error given hemoglobin was falsely low.  -Repeat CBC in the a.m.

## 2023-05-31 NOTE — ED Triage Notes (Addendum)
Pt reports her tongue started to swell 2 hrs ago. Unknown cause. Pt took 2 Benadryl tabs one Hr ago.

## 2023-05-31 NOTE — ED Provider Notes (Signed)
[place] EMERGENCY DEPARTMENT AT [facility] Provider Note   CSN: [id number] Arrival date & time: 05/31/23  1815     History  Chief Complaint  Patient presents with   Allergic Reaction    Terri Sosa is a 77 y.o. female who presents emergency department with presumed allergic reaction.  The patient reported to [facility] urgent care on [address] for onset of swelling in her tongue, itching in her throat and swelling in her submental region.  She was seen immediately and given a shot of 20 mg of IM Decadron by the staff at the urgent care.  Her blood pressure initially was about 180 and began thinking dropped down to 107.  EMS arrived and gave her 0.3 and then 0.5 mg of IM epinephrine.  They had noted visual swelling in her face tongue and anterior airway.  They do not report her having any visible hives or stridor and no voice change.  Patient does report that she still feels a little tickle in her throat.  She denies wheezing.  She has no prior history of allergic reactions.  She does take an ACE inhibitor.  She notably had a tick bite on 5/17 and was eating leftover lasagna with ground beef just prior to the onset of her swelling.   Allergic Reaction      Home Medications Prior to Admission medications   Medication Sig Start Date End Date Taking? Authorizing Provider  Biotin 2500 MCG CAPS Take by mouth.      [provider]  calcium carbonate (OS-CAL) 600 MG TABS Take 600 mg by mouth 2 (two) times daily with a meal.      [provider]  cetirizine (ZYRTEC) 10 MG tablet Take 10 mg by mouth daily.    [provider]  Cyanocobalamin (VITAMIN B 12 PO) Take 1 tablet by mouth daily.    [provider]  docusate sodium (COLACE) 100 MG capsule Take 100 mg by mouth 2 (two) times daily.    [provider]  estradiol (VIVELLE-DOT) 0.025 MG/24HR Place 0.5 patches onto the skin 2 (two) times a week.      [provider]   levothyroxine (SYNTHROID) 100 MCG tablet Take 100 mcg by mouth daily before breakfast.    [provider]  Lifitegrast (XIIDRA) 5 % SOLN Apply 1 drop to eye daily.    [provider]  lisinopril-hydrochlorothiazide (PRINZIDE,ZESTORETIC) 20-25 MG per tablet Take 1 tablet by mouth daily.      [provider]  Multiple Vitamin (MULTIVITAMIN) tablet Take 1 tablet by mouth daily.    [provider]  Omega-3 Fatty Acids (FISH OIL) 1000 MG CAPS Take 1 capsule by mouth daily.    [provider]  omeprazole (PRILOSEC) 40 MG capsule TAKE 1 CAPSULE BY MOUTH IN  THE MORNING AND AT BEDTIME 07/19/20   Tressia Danas, MD  polyethylene glycol powder (GLYCOLAX/MIRALAX) powder Take 17 g by mouth daily.      [provider]  Risankizumab-rzaa,150 MG Dose, 75 MG/0.83ML PSKT Inject into the skin.    [provider]  rosuvastatin (CRESTOR) 10 MG tablet Take 10 mg by mouth at bedtime. 09/10/20   [provider]      Allergies    Amoxicillin-pot clavulanate, Azithromycin, and Codeine    Review of Systems   Review of Systems  Physical Exam Updated Vital Signs BP (!) 166/85 (BP Location: Right Arm)   Pulse 94   Temp 98.1 F (  36.7 C) (Oral)   Resp 20   Ht 5\' 7"  (1.702 m)   Wt 89.8 kg   SpO2 99%   BMI 31.01 kg/m  Physical Exam Vitals and nursing note reviewed.  Constitutional:      General: She is not in acute distress.    Appearance: She is well-developed. She is not diaphoretic.  HENT:     Head: Normocephalic and atraumatic.     Comments: Swelling and fullness noted in the submental region.  It is firm to palpation.  Her tongue is short and very full.  Current Mallampati is 3 with a little bit of swelling in the uvula.  No stridor.  Airway is currently clear.  There is visible swelling in the hypoglossal area.    Right Ear: External ear normal.     Left Ear: External ear normal.     Nose: Nose normal.     Mouth/Throat:      Mouth: Mucous membranes are moist.  Eyes:     General: No scleral icterus.    Conjunctiva/sclera: Conjunctivae normal.  Cardiovascular:     Rate and Rhythm: Normal rate and regular rhythm.     Heart sounds: Normal heart sounds. No murmur heard.    No friction rub. No gallop.  Pulmonary:     Effort: Pulmonary effort is normal. No respiratory distress.     Breath sounds: Normal breath sounds. No wheezing.  Abdominal:     General: Bowel sounds are normal. There is no distension.     Palpations: Abdomen is soft. There is no mass.     Tenderness: There is no abdominal tenderness. There is no guarding.  Musculoskeletal:     Cervical back: Normal range of motion.  Skin:    General: Skin is warm and dry.     Findings: No rash.  Neurological:     Mental Status: She is alert and oriented to person, place, and time.  Psychiatric:        Behavior: Behavior normal.     ED Results / Procedures / Treatments   Labs (all labs ordered are listed, but only abnormal results are displayed) Labs Reviewed  BASIC METABOLIC PANEL  CBC    EKG None  Radiology No results found.  Procedures .Critical Care  Performed by: Arthor Captain, PA-C Authorized by: Arthor Captain, PA-C   Critical care provider statement:    Critical care time (minutes):  75   Critical care time was exclusive of:  Separately billable procedures and treating other patients   Critical care was necessary to treat or prevent imminent or life-threatening deterioration of the following conditions:  Shock   Critical care was time spent personally by me on the following activities:  Development of treatment plan with patient or surrogate, discussions with consultants, evaluation of patient's response to treatment, examination of patient, ordering and review of laboratory studies, ordering and review of radiographic studies, ordering and performing treatments and interventions, pulse oximetry, re-evaluation of patient's condition  and review of old charts     Medications Ordered in ED Medications  enoxaparin (LOVENOX) injection 40 mg (has no administration in time range)  sodium chloride flush (NS) 0.9 % injection 3 mL (3 mLs Intravenous Given 05/31/23 2333)  acetaminophen (TYLENOL) tablet 650 mg (has no administration in time range)    Or  acetaminophen (TYLENOL) suppository 650 mg (has no administration in time range)  ondansetron (ZOFRAN) tablet 4 mg (has no administration in time range)    Or  ondansetron (  ZOFRAN) injection 4 mg (has no administration in time range)  diphenhydrAMINE (BENADRYL) injection 25 mg (has no administration in time range)  ipratropium-albuterol (DUONEB) 0.5-2.5 (3) MG/3ML nebulizer solution 3 mL (has no administration in time range)  EPINEPHrine (EPI-PEN) injection 0.3 mg (has no administration in time range)  sodium chloride 0.9 % bolus 1,000 mL (0 mLs Intravenous Stopped 05/31/23 2020)    And  0.9 %  sodium chloride infusion (has no administration in time range)  potassium chloride (KLOR-CON) packet 40 mEq (has no administration in time range)  aspirin EC tablet 81 mg (has no administration in time range)  levothyroxine (SYNTHROID) tablet 100 mcg (has no administration in time range)  Lifitegrast 5 % SOLN 1 drop (has no administration in time range)  rosuvastatin (CRESTOR) tablet 10 mg (has no administration in time range)  losartan (COZAAR) tablet 50 mg (has no administration in time range)    And  hydrochlorothiazide (HYDRODIURIL) tablet 25 mg (has no administration in time range)  diphenhydrAMINE (BENADRYL) injection 25 mg (25 mg Intravenous Given 05/31/23 1854)  famotidine (PEPCID) IVPB 20 mg premix (0 mg Intravenous Stopped 05/31/23 2020)  ipratropium-albuterol (DUONEB) 0.5-2.5 (3) MG/3ML nebulizer solution 3 mL (3 mLs Nebulization Given 05/31/23 2020)    ED Course/ Medical Decision Making/ A&P Clinical Course as of 05/31/23 2336  Thu May 31, 2023  2007 Patient rehab [AH]   2018 I reassessed the patient who has not had any worsening in her mouth tongue or throat however is now having a small amount of wheezing.  I have ordered a DuoNeb. [AH]  2031 Platelets(!): 137 [AH]  2031 Hemoglobin(!): 7.0 [AH]  2242 Potassium(!): 3.1 [AH]  2242 Hemoglobin: 13.9 [AH]    Clinical Course User Index [AH] Arthor Captain, PA-C                             Medical Decision Making Amount and/or Complexity of Data Reviewed Labs: ordered. Decision-making details documented in ED Course. Radiology: ordered. ECG/medicine tests: ordered.  Risk OTC drugs. Prescription drug management. Decision regarding hospitalization.   77 year old female who presents emergency department with anaphylactic reaction without known provoking factor.  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  She initially had hypotension, angioedema which improved with treatment including epinephrine, Decadron, Pepcid, Benadryl.  Patient however had persistent swelling of the oropharynx and hypoglossal region. He required frequent reassessment. Ordered labs which shows elevated white blood cell count at 11.6.  Initial CBC likely with lab error as hemoglobin 7.0.  I obtained a fecal occult which was negative.  Repeat hemoglobin had hematocrit shows a hemoglobin of 14.  I discussed the case with Dr. Huel Cote who will admit the patient for observation given the fact that her swelling has not yet resolved.  She appears appropriate for admission at this time for close observation for angioedema.        Final Clinical Impression(s) / ED Diagnoses Final diagnoses:  Anaphylaxis, initial encounter  Angioedema, initial encounter    Rx / DC Orders ED Discharge Orders     None         Arthor Captain, PA-C 05/31/23 2336    Rozelle Logan, DO 06/15/23 [phone]

## 2023-05-31 NOTE — ED Notes (Signed)
TC to [facility] ED to give report on PT.

## 2023-05-31 NOTE — H&P (Addendum)
History and Physical    Patient: Terri Sosa ZOX:[id number] DOB: 08-Aug-1946 DOA: 05/31/2023 DOS: the patient was seen and examined on 05/31/2023 PCP: Melida Quitter, MD  Patient coming from: Home  Chief Complaint:  Chief Complaint  Patient presents with   Allergic Reaction   HPI: CHRYL Sosa is a 77 y.o. female with medical history significant of hypertension, hyperlipidemia, psoriasis, who presents to the ED due to an allergic reaction.   Terri Sosa states that she was at home when she felt that her throat and her tongue were suddenly swelling.  She also had some itching of her right hand.  She developed dizziness that was mild in nature and a headache.  She denies any shortness of breath initially but notes that later on, she did feel herself wheezing and this improved after receiving a breathing treatment.  She denies any previous history of allergic reactions or any known environmental allergens.  She did not eat or drink or use any products today that were new.  She notes that she has been on lisinopril for many years now.  ED Course: Patient initially presented to the urgent care at which time her blood pressure was 181/79 with heart rate of 83 respiratory rate of 21.  She was afebrile at 97.9.  She was saturating at 95% on room air.  She was given Decadron.  Shortly thereafter, patient's blood pressure decreased to 107/70 with onset of dizziness.  Due to this, EMS was called and patient was transferred to the [facility] ED.  On arrival to the ED at [facility], patient's blood pressure had improved at 166/85 with heart rate of 94.  She was saturating at 99% on room air.  Initial workup demonstrated WBC of 11.6, hemoglobin of 7.0 and platelets of 137.  Blood work was repeated with hemoglobin normalizing at 13.9.  BMP with potassium 3.1, glucose 185 and GFR above 60.  Due to lack of resolution in symptoms, [facility] was contacted for admission.  Review of Systems: As mentioned in the history of present  illness. All other systems reviewed and are negative.  Past Medical History:  Diagnosis Date   Allergy    Colon polyps    Essential hypertension, benign    GERD (gastroesophageal reflux disease)    Hearing loss in left ear    Hypertension    Mixed hyperlipidemia    Psoriasis    Dr. Emily Filbert   Skin cancer    Thyroid disease    Past Surgical History:  Procedure Laterality Date   ABDOMINAL HYSTERECTOMY     partial   PELVIC FLOOR REPAIR  2017   Social History:  reports that she quit smoking about 38 years ago. Her smoking use included cigarettes. She has a 23.00 pack-year smoking history. She has never used smokeless tobacco. She reports that she does not drink alcohol and does not use drugs.  Allergies  Allergen Reactions   Amoxicillin-Pot Clavulanate     REACTION: nausea/vomiting   Azithromycin    Codeine     REACTION: nausea/vomiting    Family History  Problem Relation Age of Onset   Arthritis Father    Asthma Father    Kidney disease Father    Cancer Sister    Heart disease Sister    Allergies Mother    Hypertension Mother    Heart disease Mother    Arthritis Mother     Prior to Admission medications   Medication Sig Start Date End Date Taking? Authorizing  Provider  aspirin EC 81 MG tablet Take 81 mg by mouth in the morning. Swallow whole.   Yes [provider]  Biotin 2500 MCG CAPS Take 2,500 mcg by mouth at bedtime.   Yes [provider]  calcium carbonate (OS-CAL) 600 MG TABS Take 600 mg by mouth 2 (two) times daily with a meal.     Yes [provider]  cetirizine (ZYRTEC) 10 MG tablet Take 10 mg by mouth daily.   Yes [provider]  Cyanocobalamin (VITAMIN B 12 PO) Take 1 tablet by mouth daily.   Yes [provider]  docusate sodium (COLACE) 100 MG capsule Take 100 mg by mouth 2 (two) times daily.   Yes [provider]  estradiol (VIVELLE-DOT) 0.025 MG/24HR Place 0.5 patches onto the skin 2 (two) times a  week.     Yes [provider]  Flaxseed, Linseed, (FLAX SEEDS PO) Take 5 mLs by mouth in the morning.   Yes [provider]  levothyroxine (SYNTHROID) 100 MCG tablet Take 100 mcg by mouth daily before breakfast.   Yes [provider]  Lifitegrast (XIIDRA) 5 % SOLN Apply 1 drop to eye daily.   Yes [provider]  lisinopril-hydrochlorothiazide (PRINZIDE,ZESTORETIC) 20-25 MG per tablet Take 1 tablet by mouth daily.     Yes [provider]  Multiple Vitamins-Minerals (EYE HEALTH PO) Take 2 tablets by mouth 2 (two) times daily.   Yes [provider]  Omega-3 Fatty Acids (FISH OIL) 1000 MG CAPS Take 1 capsule by mouth daily.   Yes [provider]  omeprazole (PRILOSEC) 40 MG capsule TAKE 1 CAPSULE BY MOUTH IN  THE MORNING AND AT BEDTIME Patient taking differently: Take 40 mg by mouth 2 (two) times daily. Take 1 capsule by mouth in the morning and at bedtime. 07/19/20  Yes Tressia Danas, MD  polyethylene glycol powder (GLYCOLAX/MIRALAX) 17 GM/SCOOP powder Take 1 Container by mouth daily. Take 1 capful daily in the Morning.   Yes [provider]  rosuvastatin (CRESTOR) 10 MG tablet Take 10 mg by mouth at bedtime. 09/10/20  Yes [provider]    Physical Exam: Vitals:   05/31/23 1823 05/31/23 1831 05/31/23 1832 05/31/23 2215  BP: (!) 171/86 (!) 166/85  137/77  Pulse: 94 94  93  Resp: 17 20  (!) 22  Temp: 98.1 F (36.7 C) 98.1 F (36.7 C)    TempSrc: Oral Oral    SpO2: 98% 99%  94%  Weight:   89.8 kg   Height:   5\' 7"  (1.702 m)    Physical Exam Vitals and nursing note reviewed.  Constitutional:      General: She is not in acute distress.    Appearance: She is not toxic-appearing.  HENT:     Head: Normocephalic and atraumatic.     Comments: Mild tongue swelling still present.  Small vesicles seen on uvula.  Oropharynx otherwise clear    Mouth/Throat:     Mouth: Mucous membranes are moist.  Eyes:      Conjunctiva/sclera: Conjunctivae normal.     Pupils: Pupils are equal, round, and reactive to light.  Neck:     Comments: Neck swelling present bilaterally in the submandibular region with no tenderness.  Overlying erythema but no clear hives. Cardiovascular:     Rate and Rhythm: Normal rate and regular rhythm.     Heart sounds: Murmur (Decrescendo systolic murmur heard in the left upper sternal border) heard.  Pulmonary:  Effort: Pulmonary effort is normal. No respiratory distress.     Breath sounds: Normal breath sounds. No wheezing, rhonchi or rales.  Musculoskeletal:     Right lower leg: No edema.     Left lower leg: No edema.  Skin:    General: Skin is warm and dry.  Neurological:     General: No focal deficit present.     Mental Status: She is alert and oriented to person, place, and time. Mental status is at baseline.  Psychiatric:        Mood and Affect: Mood normal.        Behavior: Behavior normal.    Data Reviewed: Initial CBC with WBC of 11.6, hemoglobin 7.0, platelets of 137 Repeat H&H with hemoglobin of 13.9 and hematocrit of 42 BMP with sodium of 140, potassium 3.1, bicarb 22, glucose 185, creatinine 0.78 and GFR above 60  EKG personally reviewed.  Sinus rhythm with rate of 96.  PVCs noted.  QT prolongation at 471.  DG Chest Port 1 View  Result Date: 05/31/2023 CLINICAL DATA:  Shortness of breath EXAM: PORTABLE CHEST 1 VIEW COMPARISON:  11/06/2007 FINDINGS: Cardiac size is within normal limits. There are no signs of pulmonary edema or focal pulmonary consolidation. There is no pleural effusion or pneumothorax. IMPRESSION: No active cardiopulmonary disease. Electronically Signed   By: Ernie Avena M.D.   On: 05/31/2023 19:13    There are no new results to review at this time.  Assessment and Plan:  * Anaphylaxis Patient presented with tongue and throat swelling, itching, dizziness and subsequent decrease in blood pressure and wheezing.  Symptoms have all  improved but not completely resolved.  Unclear etiology at this time  - S/p Decadron 20 mg IV once, 2 doses of IM epi, Benadryl IV, Pepcid IV and DuoNeb - Continue Benadryl as needed for hives or itching - Continue DuoNebs as needed for wheezing - Epinephrine as needed for worsening tongue/neck swelling - Frequent vital checks with telemetry monitoring overnight - Recommend patient follow-up with allergist  Essential hypertension, benign Although unlikely to be contributing, will discontinue home lisinopril recommend transitioning to losartan on discharge  - Hold home antihypertensives for now - Discontinue home lisinopril-HCTZ and start losartan-HCTZ tomorrow if BP remains stable.   Thrombocytopenia (HCC) Potentially just a lab error given hemoglobin was falsely low.  -Repeat CBC in the a.m.  Hypokalemia - Will replace and repeat in the morning  Advance Care Planning:   Code Status: DNR.  Patient states that she has discussed with her husband that she would never want CPR under any circumstances, but is okay with mechanical ventilation in the case of respiratory compromise only.  Consults: None  Family Communication: Patient's husband updated at bedside  Severity of Illness: The appropriate patient status for this patient is OBSERVATION. Observation status is judged to be reasonable and necessary in order to provide the required intensity of service to ensure the patient's safety. The patient's presenting symptoms, physical exam findings, and initial radiographic and laboratory data in the context of their medical condition is felt to place them at decreased risk for further clinical deterioration. Furthermore, it is anticipated that the patient will be medically stable for discharge from the hospital within 2 midnights of admission.   Author: Verdene Lennert, MD 05/31/2023 11:02 PM  For on call review [email or web address].

## 2023-05-31 NOTE — Assessment & Plan Note (Signed)
-   Will replace and repeat in the morning

## 2023-06-01 DIAGNOSIS — T782XXA Anaphylactic shock, unspecified, initial encounter: Secondary | ICD-10-CM | POA: Diagnosis not present

## 2023-06-01 LAB — CBC
HCT: 40.7 % (ref 36.0–46.0)
Hemoglobin: 13.5 g/dL (ref 12.0–15.0)
MCH: 30.4 pg (ref 26.0–34.0)
MCHC: 33.2 g/dL (ref 30.0–36.0)
MCV: 91.7 fL (ref 80.0–100.0)
Platelets: 230 10*3/uL (ref 150–400)
RBC: 4.44 MIL/uL (ref 3.87–5.11)
RDW: 12.7 % (ref 11.5–15.5)
WBC: 13.3 10*3/uL — ABNORMAL HIGH (ref 4.0–10.5)
nRBC: 0 % (ref 0.0–0.2)

## 2023-06-01 LAB — BASIC METABOLIC PANEL
Anion gap: 11 (ref 5–15)
BUN: 17 mg/dL (ref 8–23)
CO2: 22 mmol/L (ref 22–32)
Calcium: 8.8 mg/dL — ABNORMAL LOW (ref 8.9–10.3)
Chloride: 106 mmol/L (ref 98–111)
Creatinine, Ser: 0.75 mg/dL (ref 0.44–1.00)
GFR, Estimated: >60 mL/min (ref >60)
Glucose, Bld: 180 mg/dL — ABNORMAL HIGH (ref 70–99)
Potassium: 3.7 mmol/L (ref 3.5–5.1)
Sodium: 139 mmol/L (ref 135–145)

## 2023-06-01 MED ORDER — AMLODIPINE BESYLATE 10 MG PO TABS: 10.0000 mg | ORAL_TABLET | Freq: Two times a day (BID) | ORAL | 0 refills | Status: DC

## 2023-06-01 MED ORDER — AMLODIPINE BESYLATE 5 MG PO TABS: 5.0000 mg | ORAL_TABLET | Freq: Every day | ORAL | 0 refills | Status: AC

## 2023-06-01 MED ORDER — METHYLPREDNISOLONE SODIUM SUCC 125 MG IJ SOLR
80.0000 mg | Freq: Once | INTRAMUSCULAR | Status: AC
  Administered 2023-06-01: 80 mg via INTRAVENOUS
  Filled 2023-06-01: qty 2

## 2023-06-01 MED ORDER — FAMOTIDINE IN NACL 20-0.9 MG/50ML-% IV SOLN
20.0000 mg | Freq: Two times a day (BID) | INTRAVENOUS | Status: DC
  Administered 2023-06-01: 20 mg via INTRAVENOUS
  Filled 2023-06-01: qty 50

## 2023-06-01 MED ORDER — FAMOTIDINE 20 MG PO TABS: 20.0000 mg | ORAL_TABLET | Freq: Two times a day (BID) | ORAL | 0 refills | Status: DC

## 2023-06-01 MED ORDER — LOSARTAN POTASSIUM 50 MG PO TABS: 50.0000 mg | ORAL_TABLET | Freq: Every day | ORAL | 0 refills | Status: DC

## 2023-06-01 MED ORDER — EPINEPHRINE 0.3 MG/0.3ML IJ SOAJ: 0.3000 mg | INTRAMUSCULAR | 0 refills | Status: AC | PRN

## 2023-06-01 MED ORDER — ORAL CARE MOUTH RINSE: 15.0000 mL | OROMUCOSAL | Status: DC | PRN

## 2023-06-01 MED ORDER — HYDROCHLOROTHIAZIDE 25 MG PO TABS: 25.0000 mg | ORAL_TABLET | Freq: Every day | ORAL | 0 refills | Status: DC

## 2023-06-01 NOTE — Discharge Summary (Signed)
Physician Discharge Summary  Terri Sosa ZOX:[id number] DOB: 07-Feb-1946 DOA: 05/31/2023  PCP: Melida Quitter, MD  Admit date: 05/31/2023 Discharge date: 06/01/2023  Admitted From: Home Disposition: Home  Recommendations for Outpatient Follow-up:  Follow up with PCP in 1 week with repeat CBC/BMP Outpatient evaluation and follow-up by allergy specialist Follow up in ED if symptoms worsen or new appear   Home Health: No Equipment/Devices: None  Discharge Condition: Stable CODE STATUS: DNR Diet recommendation: Heart healthy  Brief/Interim Summary: 77 y.o. female with medical history significant of hypertension, hyperlipidemia, psoriasis presented with an allergic reaction with tongue and throat swelling and itching of right hand along with some dizziness.  She was treated with IM Decadron along with 2 doses of IM epi and IV Benadryl/Pepcid.  Subsequently, her tongue swelling is improving.  She has no trouble swallowing and she is maintaining airway.  She feels okay to go home.  She will be given 1 dose of IV Solu-Medrol this morning and discharged home today on oral Pepcid and Zyrtec.  She does not want to take prednisone.  Recommend outpatient follow-up with allergy specialist.  Lisinopril has been discontinued.  Discharge Diagnoses:   Anaphylaxis -Questionable cause.  May be related to lisinopril.  Lisinopril discontinued. -presented with an allergic reaction with tongue and throat swelling and itching of right hand along with some dizziness.  She was treated with IM Decadron along with 2 doses of IM epi and IV Benadryl/Pepcid.  Subsequently, her tongue swelling is improving.  She has no trouble swallowing and she is maintaining airway.  She feels okay to go home.  She will be given 1 dose of IV Solu-Medrol this morning and discharged home today on oral Pepcid and Zyrtec.  She does not want to take prednisone.  Recommend outpatient follow-up with allergy  specialist.  Hypertension -Lisinopril discontinued and substituted with losartan which will be continued.  Continue hydrochlorothiazide.  Patient also takes amlodipine which will be continued.  Outpatient follow-up with PCP  Thrombocytopenia -Resolved  Leukocytosis -Possibly reactive.  Hypokalemia -Improved  Hyperlipidemia -Continue statin  Obesity Outpatient follow-up  Discharge Instructions  Discharge Instructions     Ambulatory referral to Allergy   Complete by: As directed    Hospitalization for anaphylaxis   Diet - low sodium heart healthy   Complete by: As directed    Increase activity slowly   Complete by: As directed       Allergies as of 06/01/2023       Reactions   Amoxicillin-pot Clavulanate    REACTION: nausea/vomiting   Azithromycin    Codeine    REACTION: nausea/vomiting        Medication List     STOP taking these medications    FLAX SEEDS PO   lisinopril-hydrochlorothiazide 20-25 MG tablet Commonly known as: ZESTORETIC       TAKE these medications    amLODipine 5 MG tablet Commonly known as: NORVASC Take 1 tablet (5 mg total) by mouth daily.   aspirin EC 81 MG tablet Take 81 mg by mouth in the morning. Swallow whole.   Biotin 2500 MCG Caps Take 2,500 mcg by mouth at bedtime.   calcium carbonate 600 MG Tabs tablet Commonly known as: OS-CAL Take 600 mg by mouth 2 (two) times daily with a meal.   cetirizine 10 MG tablet Commonly known as: ZYRTEC Take 10 mg by mouth daily.   Colace 100 MG capsule Generic drug: docusate sodium Take 100 mg by mouth 2 (two) times  daily.   EPINEPHrine 0.3 mg/0.3 mL Soaj injection Commonly known as: EPI-PEN Inject 0.3 mg into the muscle as needed for anaphylaxis (Worsening tongue/throat swelling).   estradiol 0.025 MG/24HR Commonly known as: VIVELLE-DOT Place 0.5 patches onto the skin 2 (two) times a week.   EYE HEALTH PO Take 2 tablets by mouth 2 (two) times daily.   famotidine 20 MG  tablet Commonly known as: PEPCID Take 1 tablet (20 mg total) by mouth 2 (two) times daily for 7 days.   Fish Oil 1000 MG Caps Take 1 capsule by mouth daily.   hydrochlorothiazide 25 MG tablet Commonly known as: HYDRODIURIL Take 1 tablet (25 mg total) by mouth daily.   levothyroxine 100 MCG tablet Commonly known as: SYNTHROID Take 100 mcg by mouth daily before breakfast.   losartan 50 MG tablet Commonly known as: COZAAR Take 1 tablet (50 mg total) by mouth daily.   omeprazole 40 MG capsule Commonly known as: PRILOSEC TAKE 1 CAPSULE BY MOUTH IN  THE MORNING AND AT BEDTIME What changed: See the new instructions.   polyethylene glycol powder 17 GM/SCOOP powder Commonly known as: GLYCOLAX/MIRALAX Take 1 Container by mouth daily. Take 1 capful daily in the Morning.   rosuvastatin 10 MG tablet Commonly known as: CRESTOR Take 10 mg by mouth at bedtime.   VITAMIN B 12 PO Take 1 tablet by mouth daily.   Xiidra 5 % Soln Generic drug: Lifitegrast Apply 1 drop to eye daily.        Follow-up Information     Melida Quitter, MD. Schedule an appointment as soon as possible for a visit in 1 week(s).   Specialty: Internal Medicine Contact information: [address] [place] [address] [phone]                Allergies  Allergen Reactions   Amoxicillin-Pot Clavulanate     REACTION: nausea/vomiting   Azithromycin    Codeine     REACTION: nausea/vomiting    Consultations: None   Procedures/Studies: DG Chest Port 1 View  Result Date: 05/31/2023 CLINICAL DATA:  Shortness of breath EXAM: PORTABLE CHEST 1 VIEW COMPARISON:  11/06/2007 FINDINGS: Cardiac size is within normal limits. There are no signs of pulmonary edema or focal pulmonary consolidation. There is no pleural effusion or pneumothorax. IMPRESSION: No active cardiopulmonary disease. Electronically Signed   By: Ernie Avena M.D.   On: 05/31/2023 19:13      Subjective: Patient seen and examined  at bedside.  Feels better and thinks that her tongue swelling is improving.  Does not have any trouble swallowing or breathing.  No fever or vomiting reported.  Feels a little hungry.  Discharge Exam: Vitals:   06/01/23 0623 06/01/23 0636  BP: (!) 144/70   Pulse: 80   Resp:  20  Temp: 98.2 F (36.8 C)   SpO2: 94%     General: Pt is alert, awake, not in acute distress.  Currently on room air.  No stridor noted ENT: Tongue is slightly swollen but better as per the patient/husband at bedside Cardiovascular: rate controlled, S1/S2 + Respiratory: bilateral decreased breath sounds at bases Abdominal: Soft, obese, NT, ND, bowel sounds + Extremities: no edema, no cyanosis    The results of significant diagnostics from this hospitalization (including imaging, microbiology, ancillary and laboratory) are listed below for reference.     Microbiology: No results found for this or any previous visit (from the past 240 hour(s)).   Labs: BNP (last 3 results) No results  for input(s): "BNP" in the last 8760 hours. Basic Metabolic Panel: Recent Labs  Lab 05/31/23 2042 06/01/23 0409  NA 140 139  K 3.1* 3.7  CL 105 106  CO2 22 22  GLUCOSE 185* 180*  BUN 19 17  CREATININE 0.78 0.75  CALCIUM 8.7* 8.8*   Liver Function Tests: No results for input(s): "AST", "ALT", "ALKPHOS", "BILITOT", "PROT", "ALBUMIN" in the last 168 hours. No results for input(s): "LIPASE", "AMYLASE" in the last 168 hours. No results for input(s): "AMMONIA" in the last 168 hours. CBC: Recent Labs  Lab 05/31/23 1853 05/31/23 2042 06/01/23 0409  WBC 11.6*  --  13.3*  HGB 7.0* 13.9 13.5  HCT 22.1* 42.0 40.7  MCV 96.9  --  91.7  PLT 137*  --  230   Cardiac Enzymes: No results for input(s): "CKTOTAL", "CKMB", "CKMBINDEX", "TROPONINI" in the last 168 hours. BNP: Invalid input(s): "POCBNP" CBG: No results for input(s): "GLUCAP" in the last 168 hours. D-Dimer No results for input(s): "DDIMER" in the last 72  hours. Hgb A1c No results for input(s): "HGBA1C" in the last 72 hours. Lipid Profile No results for input(s): "CHOL", "HDL", "LDLCALC", "TRIG", "CHOLHDL", "LDLDIRECT" in the last 72 hours. Thyroid function studies No results for input(s): "TSH", "T4TOTAL", "T3FREE", "THYROIDAB" in the last 72 hours.  Invalid input(s): "FREET3" Anemia work up No results for input(s): "VITAMINB12", "FOLATE", "FERRITIN", "TIBC", "IRON", "RETICCTPCT" in the last 72 hours. Urinalysis No results found for: "COLORURINE", "APPEARANCEUR", "LABSPEC", "PHURINE", "GLUCOSEU", "HGBUR", "BILIRUBINUR", "KETONESUR", "PROTEINUR", "UROBILINOGEN", "NITRITE", "LEUKOCYTESUR" Sepsis Labs Recent Labs  Lab 05/31/23 1853 06/01/23 0409  WBC 11.6* 13.3*   Microbiology No results found for this or any previous visit (from the past 240 hour(s)).   Time coordinating discharge: 35 minutes  SIGNED:   Glade Lloyd, MD  [facility] 06/01/2023, 9:23 AM

## 2023-06-05 NOTE — Progress Notes (Unsigned)
New Patient Note  RE: Terri Sosa MRN: [id number] DOB: 02-29-1946 Date of Office Visit: 06/06/2023  Consult requested by: Glade Lloyd, MD Primary care provider: Melida Quitter, MD  Chief Complaint: No chief complaint on file.  History of Present Illness: I had the pleasure of seeing Terri Sosa for initial evaluation at the [facility] on 06/05/2023. She is a 77 y.o. female, who is referred here by Melida Quitter, MD for the evaluation of allergic reaction.  Swelling started about *** ago. Mainly occurs on her ***. Describes them as ***. Individual swelling episodes last about ***. No ecchymosis upon resolution. Associated symptoms include: ***.  Frequency of episodes: ***. Suspected triggers are ***. Denies any *** fevers, chills, changes in medications, foods, personal care products or recent infections. She has tried the following therapies: *** with *** benefit. Systemic steroids ***. Currently on ***.  Previous work up includes: ***. Previous history of swelling: {Blank single:19197::"yes","no"}. Family history of angioedema: {Blank single:19197::"yes","no"}. Patient is up to date with the following cancer screening tests: ***. Ace-inhibitor use: {Blank single:19197::"yes","no"}  05/22/2023 hospitalization: "77 y.o. female with medical history significant of hypertension, hyperlipidemia, psoriasis presented with an allergic reaction with tongue and throat swelling and itching of right hand along with some dizziness.  She was treated with IM Decadron along with 2 doses of IM epi and IV Benadryl/Pepcid.  Subsequently, her tongue swelling is improving.  She has no trouble swallowing and she is maintaining airway.  She feels okay to go home.  She will be given 1 dose of IV Solu-Medrol this morning and discharged home today on oral Pepcid and Zyrtec.  She does not want to take prednisone.  Recommend outpatient follow-up with allergy specialist.  Lisinopril has been  discontinued."  Assessment and Plan: Terri Sosa is a 77 y.o. female with: No problem-specific Assessment & Plan notes found for this encounter.  No follow-ups on file.  No orders of the defined types were placed in this encounter.  Lab Orders  No laboratory test(s) ordered today    Other allergy screening: Asthma: {Blank single:19197::"yes","no"} Rhino conjunctivitis: {Blank single:19197::"yes","no"} Food allergy: {Blank single:19197::"yes","no"} Medication allergy: {Blank single:19197::"yes","no"} Hymenoptera allergy: {Blank single:19197::"yes","no"} Urticaria: {Blank single:19197::"yes","no"} Eczema:{Blank single:19197::"yes","no"} History of recurrent infections suggestive of immunodeficency: {Blank single:19197::"yes","no"}  Diagnostics: Spirometry:  Tracings reviewed. Her effort: {Blank single:19197::"Good reproducible efforts.","It was hard to get consistent efforts and there is a question as to whether this reflects a maximal maneuver.","Poor effort, data can not be interpreted."} FVC: ***L FEV1: ***L, ***% predicted FEV1/FVC ratio: ***% Interpretation: {Blank single:19197::"Spirometry consistent with mild obstructive disease","Spirometry consistent with moderate obstructive disease","Spirometry consistent with severe obstructive disease","Spirometry consistent with possible restrictive disease","Spirometry consistent with mixed obstructive and restrictive disease","Spirometry uninterpretable due to technique","Spirometry consistent with normal pattern","No overt abnormalities noted given today's efforts"}.  Please see scanned spirometry results for details.  Skin Testing: {Blank single:19197::"Select foods","Environmental allergy panel","Environmental allergy panel and select foods","Food allergy panel","None","Deferred due to recent antihistamines use"}. *** Results discussed with patient/family.   Past Medical History: Patient Active Problem List   Diagnosis Date Noted    Anaphylaxis 05/31/2023   Thrombocytopenia (HCC) 05/31/2023   Hypokalemia 05/31/2023   Mixed conductive and sensorineural hearing loss of right ear with restricted hearing of left ear 09/15/2022   Cardiac murmur 10/22/2020   Acquired hypothyroidism 08/14/2017   De Quervain's tenosynovitis, right 04/26/2017   Postmenopausal HRT (hormone replacement therapy) 11/23/2016   Allergic rhinitis 06/06/2014   Female stress incontinence 06/06/2014  Essential hypertension, benign 10/06/2011   Mixed hyperlipidemia 10/06/2011   GERD (gastroesophageal reflux disease) 10/06/2011   Psoriasis    Hearing loss in left ear    Past Medical History:  Diagnosis Date   Allergy    Colon polyps    Essential hypertension, benign    GERD (gastroesophageal reflux disease)    Hearing loss in left ear    Hypertension    Mixed hyperlipidemia    Psoriasis    Dr. Emily Filbert   Skin cancer    Thyroid disease    Past Surgical History: Past Surgical History:  Procedure Laterality Date   ABDOMINAL HYSTERECTOMY     partial   PELVIC FLOOR REPAIR  2017   Medication List:  Current Outpatient Medications  Medication Sig Dispense Refill   amLODipine (NORVASC) 5 MG tablet Take 1 tablet (5 mg total) by mouth daily. 30 tablet 0   aspirin EC 81 MG tablet Take 81 mg by mouth in the morning. Swallow whole.     Biotin 2500 MCG CAPS Take 2,500 mcg by mouth at bedtime.     calcium carbonate (OS-CAL) 600 MG TABS Take 600 mg by mouth 2 (two) times daily with a meal.       cetirizine (ZYRTEC) 10 MG tablet Take 10 mg by mouth daily.     Cyanocobalamin (VITAMIN B 12 PO) Take 1 tablet by mouth daily.     docusate sodium (COLACE) 100 MG capsule Take 100 mg by mouth 2 (two) times daily.     EPINEPHrine 0.3 mg/0.3 mL IJ SOAJ injection Inject 0.3 mg into the muscle as needed for anaphylaxis (Worsening tongue/throat swelling). 1 each 0   estradiol (VIVELLE-DOT) 0.025 MG/24HR Place 0.5 patches onto the skin 2 (two) times a week.        famotidine (PEPCID) 20 MG tablet Take 1 tablet (20 mg total) by mouth 2 (two) times daily for 7 days. 14 tablet 0   hydrochlorothiazide (HYDRODIURIL) 25 MG tablet Take 1 tablet (25 mg total) by mouth daily. 30 tablet 0   levothyroxine (SYNTHROID) 100 MCG tablet Take 100 mcg by mouth daily before breakfast.     Lifitegrast (XIIDRA) 5 % SOLN Apply 1 drop to eye daily.     losartan (COZAAR) 50 MG tablet Take 1 tablet (50 mg total) by mouth daily. 30 tablet 0   Multiple Vitamins-Minerals (EYE HEALTH PO) Take 2 tablets by mouth 2 (two) times daily.     Omega-3 Fatty Acids (FISH OIL) 1000 MG CAPS Take 1 capsule by mouth daily.     omeprazole (PRILOSEC) 40 MG capsule TAKE 1 CAPSULE BY MOUTH IN  THE MORNING AND AT BEDTIME (Patient taking differently: Take 40 mg by mouth 2 (two) times daily. Take 1 capsule by mouth in the morning and at bedtime.) 180 capsule 3   polyethylene glycol powder (GLYCOLAX/MIRALAX) 17 GM/SCOOP powder Take 1 Container by mouth daily. Take 1 capful daily in the Morning.     rosuvastatin (CRESTOR) 10 MG tablet Take 10 mg by mouth at bedtime.     No current facility-administered medications for this visit.   Allergies: Allergies  Allergen Reactions   Amoxicillin-Pot Clavulanate     REACTION: nausea/vomiting   Azithromycin    Codeine     REACTION: nausea/vomiting   Social History: Social History   Socioeconomic History   Marital status: Married    Spouse name: Not on file   Number of children: Not on file   Years of education: Not on  file   Highest education level: Not on file  Occupational History   Not on file  Tobacco Use   Smoking status: Former    Packs/day: 1.00    Years: 23.00    Additional pack years: 0.00    Total pack years: 23.00    Types: Cigarettes    Quit date: 12/17/1984    Years since quitting: 38.4   Smokeless tobacco: Never  Substance and Sexual Activity   Alcohol use: No   Drug use: No   Sexual activity: Not on file  Other Topics Concern    Not on file  Social History Narrative   Not on file   Social Determinants of Health   Financial Resource Strain: Not on file  Food Insecurity: No Food Insecurity (06/01/2023)   Hunger Vital Sign    Worried About Running Out of Food in the Last Year: Never true    Ran Out of Food in the Last Year: Never true  Transportation Needs: No Transportation Needs (06/01/2023)   PRAPARE - Administrator, Civil Service (Medical): No    Lack of Transportation (Non-Medical): No  Physical Activity: Not on file  Stress: Not on file  Social Connections: Not on file   Lives in a ***. Smoking: *** Occupation: ***  Environmental HistorySurveyor, minerals in the house: Copywriter, advertising in the family room: {Blank single:19197::"yes","no"} Carpet in the bedroom: {Blank single:19197::"yes","no"} Heating: {Blank single:19197::"electric","gas","heat pump"} Cooling: {Blank single:19197::"central","window","heat pump"} Pet: {Blank single:19197::"yes ***","no"}  Family History: Family History  Problem Relation Age of Onset   Arthritis Father    Asthma Father    Kidney disease Father    Cancer Sister    Heart disease Sister    Allergies Mother    Hypertension Mother    Heart disease Mother    Arthritis Mother    Problem                               Relation Asthma                                   *** Eczema                                *** Food allergy                          *** Allergic rhino conjunctivitis     ***  Review of Systems  Constitutional:  Negative for appetite change, chills, fever and unexpected weight change.  HENT:  Negative for congestion and rhinorrhea.   Eyes:  Negative for itching.  Respiratory:  Negative for cough, chest tightness, shortness of breath and wheezing.   Cardiovascular:  Negative for chest pain.  Gastrointestinal:  Negative for abdominal pain.  Genitourinary:  Negative for difficulty urinating.  Skin:  Negative  for rash.  Neurological:  Negative for headaches.    Objective: There were no vitals taken for this visit. There is no height or weight on file to calculate BMI. Physical Exam Vitals and nursing note reviewed.  Constitutional:      Appearance: Normal appearance. She is well-developed.  HENT:     Head: Normocephalic and atraumatic.     Right Ear: Tympanic membrane and external ear normal.  Left Ear: Tympanic membrane and external ear normal.     Nose: Nose normal.     Mouth/Throat:     Mouth: Mucous membranes are moist.     Pharynx: Oropharynx is clear.  Eyes:     Conjunctiva/sclera: Conjunctivae normal.  Cardiovascular:     Rate and Rhythm: Normal rate and regular rhythm.     Heart sounds: Normal heart sounds. No murmur heard.    No friction rub. No gallop.  Pulmonary:     Effort: Pulmonary effort is normal.     Breath sounds: Normal breath sounds. No wheezing, rhonchi or rales.  Musculoskeletal:     Cervical back: Neck supple.  Skin:    General: Skin is warm.     Findings: No rash.  Neurological:     Mental Status: She is alert and oriented to person, place, and time.  Psychiatric:        Behavior: Behavior normal.    The plan was reviewed with the patient/family, and all questions/concerned were addressed.  It was my pleasure to see Terri Sosa today and participate in her care. Please feel free to contact me with any questions or concerns.  Sincerely,  Wyline Mood, DO Allergy & Immunology  [facility] office: [phone] [facility] office: [phone]

## 2023-06-06 ENCOUNTER — Encounter: Payer: Self-pay | Admitting: Allergy

## 2023-06-06 ENCOUNTER — Other Ambulatory Visit: Payer: Self-pay

## 2023-06-06 ENCOUNTER — Ambulatory Visit (INDEPENDENT_AMBULATORY_CARE_PROVIDER_SITE_OTHER): Payer: Medicare Other | Admitting: Allergy

## 2023-06-06 VITALS — BP 148/82 | HR 77 | Temp 98.9°F | Ht 67.32 in | Wt 199.0 lb

## 2023-06-06 DIAGNOSIS — T783XXD Angioneurotic edema, subsequent encounter: Secondary | ICD-10-CM | POA: Diagnosis not present

## 2023-06-06 DIAGNOSIS — K219 Gastro-esophageal reflux disease without esophagitis: Secondary | ICD-10-CM | POA: Diagnosis not present

## 2023-06-06 DIAGNOSIS — T7840XD Allergy, unspecified, subsequent encounter: Secondary | ICD-10-CM | POA: Diagnosis not present

## 2023-06-06 DIAGNOSIS — T7840XA Allergy, unspecified, initial encounter: Secondary | ICD-10-CM | POA: Insufficient documentation

## 2023-06-06 DIAGNOSIS — T783XXA Angioneurotic edema, initial encounter: Secondary | ICD-10-CM | POA: Insufficient documentation

## 2023-06-06 MED ORDER — FAMOTIDINE 20 MG PO TABS: 20.0000 mg | ORAL_TABLET | Freq: Two times a day (BID) | ORAL | 2 refills | Status: AC

## 2023-06-06 NOTE — Assessment & Plan Note (Signed)
Facial angioedema requiring epi x 2, decadron, benadryl and Pepcid with hospitalization in June 2024. No immediate relief with epi. No prior reaction like this. Patient was on lisinopril for 20 years and this was stopped. She also had a tick bite in May and had spaghetti with ground beef a few hours before onset. Denies any changes in diet, meds, personal care products. Angioedema may be isolated or associated with urticaria. It may be histamine-induced (i.e. foods or certain medications) or bradykinin-mediated (i.e. Hereditary Angioedema or ACE-I). Patients with histamine-induced angioedema are more likely to have associated symptoms of urticaria, pruritus or signs of anaphylaxis. Patients with bradykinin-mediated do not have any symptoms of pruritus or urticaria and they did not improve with Benadryl or Epinephrine. ACE-inhibitors are notorious to cause angioedema and has an incidence of 0.1-0.7% and orolingual involvement is common. ACE is involved in the degradation of bradykinin and blocking it can cause increase in bradykinin resulting in angioedema. Discontinue Lisinopril and avoid all ACE-inhibitors in the future. Try to avoid NSAIDs if possible as this class has been known to lower threshold for urticaria and angioedema. Get bloodwork. Avoid all mammalian meat for now - no pork, beef, lamb.  Epinephrine injectable device - demonstrated proper use. For mild symptoms you can take over the counter antihistamines such as Benadryl 1-2 tablets = 25-50mg  and monitor symptoms closely. If symptoms worsen or if you have severe symptoms including breathing issues, throat closure, significant swelling, whole body hives, severe diarrhea and vomiting, lightheadedness then inject epinephrine and seek immediate medical care afterwards. Emergency action plan given.

## 2023-06-06 NOTE — Patient Instructions (Addendum)
Angioedema may be isolated or associated with urticaria. It may be histamine-induced (i.e. foods or certain medications) or bradykinin-mediated (i.e. Hereditary Angioedema or ACE-I). Patients with histamine-induced angioedema are more likely to have associated symptoms of urticaria, pruritus or signs of anaphylaxis. Patients with bradykinin-mediated do not have any symptoms of pruritus or urticaria and they did not improve with Benadryl or Epinephrine. ACE-inhibitors are notorious to cause angioedema and has an incidence of 0.1-0.7% and orolingual involvement is common. ACE is involved in the degradation of bradykinin and blocking it can cause increase in bradykinin resulting in angioedema. Discontinue Lisinopril and avoid all ACE-inhibitors in the future. Try to avoid NSAIDs if possible as this class has been known to lower threshhold for urticaria and angioedema. Get bloodwork We are ordering labs, so please allow 1-2 weeks for the results to come back. With the newly implemented Cures Act, the labs might be visible to you at the same time that they become visible to me. However, I will not address the results until all of the results are back, so please be patient.  In the meantime, continue recommendations in your patient instructions, including avoidance measures (if applicable), until you hear from me. Avoid all mammalian meat for now - no pork, beef, lamb.   Epinephrine injectable device - demonstrated proper use. For mild symptoms you can take over the counter antihistamines such as Benadryl 1-2 tablets = 25-50mg  and monitor symptoms closely. If symptoms worsen or if you have severe symptoms including breathing issues, throat closure, significant swelling, whole body hives, severe diarrhea and vomiting, lightheadedness then inject epinephrine and seek immediate medical care afterwards. Emergency action plan given.   Follow up in 2 months or sooner if needed.

## 2023-06-07 LAB — PROTEIN ELECTROPHORESIS, SERUM

## 2023-06-07 LAB — ALPHA-GAL PANEL

## 2023-06-07 LAB — C1 ESTERASE INHIBITOR, FUNCTIONAL

## 2023-06-07 LAB — COMPREHENSIVE METABOLIC PANEL
Alkaline Phosphatase: 106 IU/L (ref 44–121)
BUN/Creatinine Ratio: 24 (ref 12–28)
Glucose: 112 mg/dL — ABNORMAL HIGH (ref 70–99)
Total Protein: 7.5 g/dL (ref 6.0–8.5)

## 2023-06-07 LAB — C1 ESTERASE INHIBITOR: C1INH SerPl-mCnc: <8 mg/dL — ABNORMAL LOW (ref 21–39)

## 2023-06-07 LAB — CBC WITH DIFFERENTIAL/PLATELET
Basos: 1 %
Monocytes Absolute: 0.9 10*3/uL (ref 0.1–0.9)

## 2023-06-07 LAB — ANA W/REFLEX: Anti Nuclear Antibody (ANA): NEGATIVE

## 2023-06-07 LAB — PROTEIN ELECTROPHORESIS, URINE REFLEX

## 2023-06-07 LAB — ALLERGEN HYMENOPTERA PANEL

## 2023-06-08 LAB — COMPREHENSIVE METABOLIC PANEL
Chloride: 98 mmol/L (ref 96–106)
Creatinine, Ser: 0.71 mg/dL (ref 0.57–1.00)

## 2023-06-08 LAB — THYROID CASCADE PROFILE: TSH: 1.75 u[IU]/mL (ref 0.450–4.500)

## 2023-06-08 LAB — ALPHA-GAL PANEL

## 2023-06-08 LAB — CBC WITH DIFFERENTIAL/PLATELET
Basophils Absolute: 0.1 10*3/uL (ref 0.0–0.2)
Neutrophils: 63 %

## 2023-06-08 LAB — PROTEIN ELECTROPHORESIS, URINE REFLEX

## 2023-06-09 LAB — COMPREHENSIVE METABOLIC PANEL: Potassium: 3.7 mmol/L (ref 3.5–5.2)

## 2023-06-09 LAB — PROTEIN ELECTROPHORESIS, URINE REFLEX
Alpha-1-Globulin, U: 0 %
Beta Globulin, U: 0 %

## 2023-06-09 LAB — ALLERGEN HYMENOPTERA PANEL

## 2023-06-09 LAB — ALPHA-GAL PANEL: Allergen Lamb IgE: 10 kU/L — AB

## 2023-06-09 LAB — CBC WITH DIFFERENTIAL/PLATELET
EOS (ABSOLUTE): 0.2 10*3/uL (ref 0.0–0.4)
Eos: 2 %
Hematocrit: 47 % — ABNORMAL HIGH (ref 34.0–46.6)
Immature Grans (Abs): 0.1 10*3/uL (ref 0.0–0.1)

## 2023-06-09 LAB — COMPLEMENT COMPONENT C1Q

## 2023-06-10 LAB — CBC WITH DIFFERENTIAL/PLATELET
Lymphocytes Absolute: 2.5 10*3/uL (ref 0.7–3.1)
MCHC: 32.8 g/dL (ref 31.5–35.7)
Monocytes: 9 %
RBC: 5.23 x10E6/uL (ref 3.77–5.28)

## 2023-06-10 LAB — ALLERGEN HYMENOPTERA PANEL
Hornet, White Face, IgE: <0.1 kU/L
Paper Wasp IgE: <0.1 kU/L

## 2023-06-10 LAB — PROTEIN ELECTROPHORESIS, SERUM

## 2023-06-11 ENCOUNTER — Telehealth: Payer: Self-pay | Admitting: Allergy

## 2023-06-11 LAB — PROTEIN ELECTROPHORESIS, SERUM
Albumin ELP: 4 g/dL (ref 2.9–4.4)
Gamma Globulin: 0.9 g/dL (ref 0.4–1.8)
Globulin, Total: 3.5 g/dL (ref 2.2–3.9)

## 2023-06-11 LAB — CBC WITH DIFFERENTIAL/PLATELET
Hemoglobin: 15.4 g/dL (ref 11.1–15.9)
Lymphs: 24 %
MCH: 29.4 pg (ref 26.6–33.0)
RDW: 13 % (ref 11.7–15.4)
WBC: 10.5 10*3/uL (ref 3.4–10.8)

## 2023-06-11 LAB — C3 AND C4: Complement C3, Serum: 194 mg/dL — ABNORMAL HIGH (ref 82–167)

## 2023-06-11 LAB — COMPREHENSIVE METABOLIC PANEL
AST: 20 IU/L (ref 0–40)
Albumin: 4.8 g/dL (ref 3.8–4.8)
Calcium: 9.9 mg/dL (ref 8.7–10.3)
Sodium: 142 mmol/L (ref 134–144)
eGFR: 89 mL/min/{1.73_m2} (ref >59)

## 2023-06-11 LAB — ALPHA-GAL PANEL: IgE (Immunoglobulin E), Serum: 353 IU/mL (ref 6–495)

## 2023-06-11 LAB — ALLERGEN HYMENOPTERA PANEL: Hornet, Yellow, IgE: <0.1 kU/L

## 2023-06-11 NOTE — Telephone Encounter (Signed)
Please call patient and her schedule appointment in July for follow up and bloodwork. I already spoke with her regarding some of her lab results.

## 2023-06-12 NOTE — Telephone Encounter (Signed)
Done

## 2023-06-13 LAB — PROTEIN ELECTROPHORESIS, URINE REFLEX
Albumin ELP, Urine: 100 %
Alpha-2-Globulin, U: 0 %

## 2023-06-13 LAB — CBC WITH DIFFERENTIAL/PLATELET
Immature Granulocytes: 1 %
MCV: 90 fL (ref 79–97)
Neutrophils Absolute: 6.8 10*3/uL (ref 1.4–7.0)
Platelets: 243 10*3/uL (ref 150–450)

## 2023-06-13 LAB — PROTEIN ELECTROPHORESIS, SERUM
A/G Ratio: 1.1 (ref 0.7–1.7)
Alpha 1: 0.2 g/dL (ref 0.0–0.4)

## 2023-06-13 LAB — COMPREHENSIVE METABOLIC PANEL
ALT: 28 IU/L (ref 0–32)
BUN: 17 mg/dL (ref 8–27)
Bilirubin Total: 0.4 mg/dL (ref 0.0–1.2)
CO2: 24 mmol/L (ref 20–29)
Globulin, Total: 2.7 g/dL (ref 1.5–4.5)

## 2023-06-13 LAB — SEDIMENTATION RATE: Sed Rate: 21 mm/hr (ref 0–40)

## 2023-06-13 LAB — ALLERGEN FIRE ANT: I070-IgE Fire Ant (Invicta): <0.1 kU/L

## 2023-06-13 LAB — ALLERGEN HYMENOPTERA PANEL: Bumblebee: <0.1 kU/L

## 2023-06-13 LAB — ALPHA-GAL PANEL: Pork IgE: 6.21 kU/L — AB

## 2023-06-13 LAB — TRYPTASE: Tryptase: 11.5 ug/L (ref 2.2–13.2)

## 2023-06-13 LAB — C3 AND C4: Complement C4, Serum: 29 mg/dL (ref 12–38)

## 2023-06-27 ENCOUNTER — Encounter: Payer: Self-pay | Admitting: Allergy

## 2023-06-27 ENCOUNTER — Ambulatory Visit: Payer: Medicare Other | Admitting: Allergy

## 2023-06-27 ENCOUNTER — Other Ambulatory Visit: Payer: Self-pay

## 2023-06-27 VITALS — BP 160/80 | HR 100 | Temp 98.1°F | Resp 16 | Wt 203.3 lb

## 2023-06-27 DIAGNOSIS — K219 Gastro-esophageal reflux disease without esophagitis: Secondary | ICD-10-CM | POA: Diagnosis not present

## 2023-06-27 DIAGNOSIS — R030 Elevated blood-pressure reading, without diagnosis of hypertension: Secondary | ICD-10-CM | POA: Diagnosis not present

## 2023-06-27 DIAGNOSIS — T783XXD Angioneurotic edema, subsequent encounter: Secondary | ICD-10-CM

## 2023-06-27 DIAGNOSIS — Z91018 Allergy to other foods: Secondary | ICD-10-CM | POA: Diagnosis not present

## 2023-06-27 NOTE — Assessment & Plan Note (Signed)
Past history - 2024 bloodwork positive to alpha gal. Continue strict avoidance of all mammalian meat. For mild symptoms you can take over the counter antihistamines such as Benadryl and monitor symptoms closely. If symptoms worsen or if you have severe symptoms including breathing issues, throat closure, significant swelling, whole body hives, severe diarrhea and vomiting, lightheadedness then seek immediate medical care. Action plan in place. Alpha gal handout given. Recheck at next visit.

## 2023-06-27 NOTE — Patient Instructions (Addendum)
Swelling Keep track of episodes and if you have one - let us know.  Continue to avoid lisinopril and avoid all ACE-inhibitors in the future. Try to avoid NSAIDs if possible as this class has been known to lower threshhold for urticaria and angioedema.  Get bloodwork to recheck your c1 esterase inhibitor level. If this is still low then we may need to add on a different medication. We are ordering labs, so please allow 1-2 weeks for the results to come back. With the newly implemented Cures Act, the labs might be visible to you at the same time that they become visible to me. However, I will not address the results until all of the results are back, so please be patient.  In the meantime, continue recommendations in your patient instructions, including avoidance measures (if applicable), until you hear from me.  Alpha gal allergy  Continue strict avoidance of all mammalian meat. For mild symptoms you can take over the counter antihistamines such as Benadryl and monitor symptoms closely. If symptoms worsen or if you have severe symptoms including breathing issues, throat closure, significant swelling, whole body hives, severe diarrhea and vomiting, lightheadedness then seek immediate medical care. Action plan in place.   Follow up in 6 months or sooner if needed. Follow up with PCP regarding your blood pressure.

## 2023-06-27 NOTE — Progress Notes (Signed)
Reviewed labs during visit. Positive to alpha-gal.  C1 esterase inhibitor low, normal C3, slightly elevated C4 C1 esterase function, c1Q, spep, upep, cbc diff, cmp, ana, tryptase, hymenoptera panel, fire ant, tsh, sed rate all normal.

## 2023-06-27 NOTE — Assessment & Plan Note (Signed)
Past history - facial angioedema requiring epi x 2, decadron, benadryl and Pepcid with hospitalization in June 2024. No immediate relief with epi. No prior reaction like this. Patient was on lisinopril for 20 years and this was stopped. She also had a tick bite in May and had spaghetti with ground beef a few hours before onset. Denies any changes in diet, meds, personal care products. Interim history - no additional episodes. Off lisinopril and avoiding red meat. No family history of angioedema. Reviewed 2024 bloodwork: Positive to alpha-gal, C1 esterase inhibitor low, normal C3, slightly elevated C4. C1 esterase function, c1Q, spep, upep, cbc diff, cmp, ana, tryptase, hymenoptera panel, fire ant, tsh, sed rate all normal.  Discussed with patient that based on all these results and clinical history the above reaction could have been from lisinopril and/or alpha gal allergy and need to rule out HAE.  Recheck C1 esterase inhibitor level. If this is still low then will need a different medication for angioedema episodes.  Keep track of episodes and if you have one - let us know. Continue to avoid lisinopril and avoid all ACE-inhibitors in the future. Try to avoid NSAIDs if possible as this class has been known to lower threshold for urticaria and angioedema.

## 2023-06-27 NOTE — Progress Notes (Signed)
Follow Up Note  RE: Terri Sosa MRN: [id number] DOB: 03/26/1946 Date of Office Visit: 06/27/2023  Referring provider: Melida Quitter, MD Primary care provider: Melida Quitter, MD  Chief Complaint: Other (Blood work follow up )  History of Present Illness: I had the pleasure of seeing Terri Sosa for a follow up visit at the [facility] on 06/27/2023. She is a 77 y.o. female, who is being followed for angioedema. Her previous allergy office visit was on 06/06/2023 with Dr. Selena Batten. Today is a regular follow up visit.  Angio-edema No additional swelling and avoiding red meat.  Avoiding lisinopril.   Has Epipen on hand just in case.   Assessment and Plan: Tkeya is a 77 y.o. 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 No immediate relief with epi. No prior reaction like this. Patient was on lisinopril for 20 years and this was stopped. She also had a tick bite in May and had spaghetti with ground beef a few hours before onset. Denies any changes in diet, meds, personal care products. Interim history - no additional episodes. Off lisinopril and avoiding red meat. No family history of angioedema. Reviewed 2024 bloodwork: Positive to alpha-gal, C1 esterase inhibitor low, normal C3, slightly elevated C4. C1 esterase function, c1Q, spep, upep, cbc diff, cmp, ana, tryptase, hymenoptera panel, fire ant, tsh, sed rate all normal.  Discussed with patient that based on all these results and clinical history the above reaction could have been from lisinopril and/or alpha gal allergy and need to rule out HAE.  Recheck C1 esterase inhibitor level. If this is still low then will need a different medication for angioedema episodes.  Keep track of episodes and if you have one - let us know. Continue to avoid lisinopril and avoid all ACE-inhibitors in the future. Try to avoid NSAIDs if possible  as this class has been known to lower threshold for urticaria and angioedema.  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 Continue strict avoidance of all mammalian meat. For mild symptoms you can take over the counter antihistamines such as Benadryl and monitor symptoms closely. If symptoms worsen or if you have severe symptoms including breathing issues, throat closure, significant swelling, whole body hives, severe diarrhea and vomiting, lightheadedness then seek immediate medical care. Action plan in place. Alpha gal handout given. Recheck at next visit.   Elevated blood pressure reading Follow up with PCP regarding your blood pressure.   Return in about 6 months (around 12/28/2023).  No orders of the defined types were placed in this encounter.  Lab Orders         C1 Esterase Inhibitor         Tryptase      Diagnostics: None.   Medication List:  Current Outpatient Medications  Medication Sig Dispense Refill   amLODipine (NORVASC) 5 MG tablet Take 1 tablet (5 mg total) by mouth daily. 30 tablet 0   aspirin EC 81 MG tablet Take 81 mg by mouth in the morning. Swallow whole.     calcium carbonate (OS-CAL) 600 MG TABS Take 600 mg by mouth 2 (two) times daily with a meal.       cetirizine (ZYRTEC) 10 MG tablet Take 10 mg by mouth daily.     Cyanocobalamin (VITAMIN B 12 PO) Take 1 tablet by mouth daily.     EPINEPHrine 0.3  mg/0.3 mL IJ SOAJ injection Inject 0.3 mg into the muscle as needed for anaphylaxis (Worsening tongue/throat swelling). 1 each 0   estradiol (VIVELLE-DOT) 0.025 MG/24HR Place 0.5 patches onto the skin 2 (two) times a week.       famotidine (PEPCID) 20 MG tablet Take 1 tablet (20 mg total) by mouth 2 (two) times daily. 60 tablet 2   levothyroxine (SYNTHROID) 100 MCG tablet Take 100 mcg by mouth daily before breakfast.     Lifitegrast (XIIDRA) 5 % SOLN Apply 1 drop to eye daily.     losartan-hydrochlorothiazide (HYZAAR) 100-25 MG  tablet Take 1 tablet by mouth daily.     Omega-3 Fatty Acids (FISH OIL) 1000 MG CAPS Take 1 capsule by mouth daily.     omeprazole (PRILOSEC) 40 MG capsule TAKE 1 CAPSULE BY MOUTH IN  THE MORNING AND AT BEDTIME (Patient taking differently: Take 40 mg by mouth 2 (two) times daily. Take 1 capsule by mouth in the morning and at bedtime.) 180 capsule 3   polyethylene glycol powder (GLYCOLAX/MIRALAX) 17 GM/SCOOP powder Take 1 Container by mouth daily. Take 1 capful daily in the Morning.     rosuvastatin (CRESTOR) 10 MG tablet Take 10 mg by mouth at bedtime.     No current facility-administered medications for this visit.   Allergies: Allergies  Allergen Reactions   Alpha-Gal    Amoxicillin-Pot Clavulanate     REACTION: nausea/vomiting   Augmentin [Amoxicillin-Pot Clavulanate]    Azithromycin    Codeine     REACTION: nausea/vomiting   Lisinopril     angioedema   Lisinopril-Hydrochlorothiazide    Prednisone    I reviewed her past medical history, social history, family history, and environmental history and no significant changes have been reported from her previous visit.  Review of Systems  Constitutional:  Negative for appetite change, chills, fever and unexpected weight change.  HENT:  Negative for congestion and rhinorrhea.   Eyes:  Negative for itching.  Respiratory:  Negative for cough, chest tightness, shortness of breath and wheezing.   Cardiovascular:  Negative for chest pain.  Gastrointestinal:  Negative for abdominal pain.  Genitourinary:  Negative for difficulty urinating.  Skin:  Negative for rash.  Allergic/Immunologic: Positive for food allergies.  Neurological:  Negative for headaches.    Objective: BP (!) 160/80   Pulse 100   Temp 98.1 F (36.7 C)   Resp 16   Wt 203 lb 4.8 oz (92.2 kg)   SpO2 94%   BMI 31.54 kg/m  Body mass index is 31.54 kg/m. Physical Exam Vitals and nursing note reviewed.  Constitutional:      Appearance: Normal appearance. She is  well-developed.  HENT:     Head: Normocephalic and atraumatic.     Right Ear: Tympanic membrane and external ear normal.     Left Ear: Tympanic membrane and external ear normal.     Nose: Nose normal.     Mouth/Throat:     Mouth: Mucous membranes are moist.     Pharynx: Oropharynx is clear.  Eyes:     Conjunctiva/sclera: Conjunctivae normal.  Cardiovascular:     Rate and Rhythm: Normal rate and regular rhythm.     Heart sounds: Normal heart sounds. No murmur heard.    No friction rub. No gallop.  Pulmonary:     Effort: Pulmonary effort is normal.     Breath sounds: Normal breath sounds. No wheezing, rhonchi or rales.  Musculoskeletal:     Cervical back: Neck supple.  Skin:    General: Skin is warm.     Findings: No rash.  Neurological:     Mental Status: She is alert and oriented to person, place, and time.  Psychiatric:        Behavior: Behavior normal.   Previous notes and tests were reviewed. The plan was reviewed with the patient/family, and all questions/concerned were addressed.  It was my pleasure to see Latavia today and participate in her care. Please feel free to contact me with any questions or concerns.  Sincerely,  Wyline Mood, DO Allergy & Immunology  [facility] office: [phone] [facility] office: [phone]

## 2023-06-27 NOTE — Assessment & Plan Note (Signed)
   Follow up with PCP regarding your blood pressure.  

## 2023-06-29 LAB — C1 ESTERASE INHIBITOR: C1INH SerPl-mCnc: 30 mg/dL (ref 21–39)

## 2023-06-29 LAB — TRYPTASE: Tryptase: 11.9 ug/L (ref 2.2–13.2)

## 2023-08-06 ENCOUNTER — Ambulatory Visit: Payer: Medicare Other | Admitting: Allergy

## 2023-08-29 ENCOUNTER — Telehealth: Payer: Self-pay | Admitting: Allergy

## 2023-08-29 NOTE — Telephone Encounter (Signed)
Pt informed of what Dr Selena Batten stated.

## 2023-08-29 NOTE — Telephone Encounter (Signed)
Terri Sosa called in and stated she was a little concerned.  Terri Sosa states she has Alpha-Gal and found another lone star tick on her this morning.  I asked her if there was any redness, and she states the spot she pulled the tick from is now red and has a knot under her skin.  Terri Sosa states she read online that if you get another tick bite after the first one it can be serious and she is concerned.  Terri Sosa states if she gets a call back BEFORE lunch to please call her home number, after lunch to please call her cell.

## 2023-08-29 NOTE — Telephone Encounter (Signed)
Please call patient back.  The tick bite itself should not cause a systemic allergic reaction.   Localized reactions are common and she can use topical benadryl or steroid cream to treat those symptoms.   Continue to avoid all mammalian meat.

## 2023-11-13 ENCOUNTER — Encounter: Payer: Self-pay | Admitting: Gastroenterology

## 2023-11-13 ENCOUNTER — Ambulatory Visit (AMBULATORY_SURGERY_CENTER): Payer: Medicare Other

## 2023-11-13 VITALS — Ht 67.0 in | Wt 192.0 lb

## 2023-11-13 DIAGNOSIS — Z860100 Personal history of colon polyps, unspecified: Secondary | ICD-10-CM

## 2023-11-13 MED ORDER — NA SULFATE-K SULFATE-MG SULF 17.5-3.13-1.6 GM/177ML PO SOLN
1.0000 | Freq: Once | ORAL | 0 refills | Status: AC
Start: 2023-11-13 — End: 2023-11-13

## 2023-11-13 NOTE — Progress Notes (Signed)
Pt's name and DOB verified at the beginning of the pre-visit wit 2 identifiers  Pt denies any difficulty with ambulating,sitting, laying down or rolling side to side  Pt has issues with ambulation   Pt has no issues moving head neck or swallowing  No egg or soy allergy known to patient   Pt has hx of PONV  Pt denies having issues being intubated  No FH of Malignant Hyperthermia  Pt is  on Ozempic instructed to hold for 7 days  Pt is not on home 02   Pt is not on blood thinners   Pt has frequent issues with constipation RN instructed pt to use Miralax per bottles instructions a week before prep days. Pt states they will  Pt is not on dialysis  Pt denise any abnormal heart rhythms   Pt denies any upcoming cardiac testing  Pt encouraged to use to use Singlecare or Goodrx to reduce cost   Patient's chart reviewed by Cathlyn Parsons CNRA prior to pre-visit and patient appropriate for the [facility].  Pre-visit completed and red dot placed by patient's name on their procedure day (on provider's schedule).  .  Visit by phone   Pt states weight is 192 lb   Instructed pt why it is important to and  to call if they have any changes in health or new medications. Directed them to the # given and on instructions.     Instructions reviewed. Pt given both [facility] main # and MD on call # prior to instructions.  Pt states understanding. Instructed to review again prior to procedure. Pt states they will.   Instructions sent by mail with coupon and by My Chart   Coupon sent via text to mobile phone and pt verified they received it

## 2023-11-16 ENCOUNTER — Encounter: Payer: Self-pay | Admitting: Gastroenterology

## 2023-11-27 ENCOUNTER — Encounter: Payer: Self-pay | Admitting: Certified Registered Nurse Anesthetist

## 2023-11-29 ENCOUNTER — Ambulatory Visit: Payer: Medicare Other | Admitting: Gastroenterology

## 2023-11-29 ENCOUNTER — Encounter: Payer: Self-pay | Admitting: Gastroenterology

## 2023-11-29 VITALS — BP 101/58 | HR 61 | Temp 98.2°F | Resp 18 | Ht 67.0 in | Wt 192.0 lb

## 2023-11-29 DIAGNOSIS — Z1211 Encounter for screening for malignant neoplasm of colon: Secondary | ICD-10-CM

## 2023-11-29 DIAGNOSIS — K644 Residual hemorrhoidal skin tags: Secondary | ICD-10-CM

## 2023-11-29 DIAGNOSIS — D125 Benign neoplasm of sigmoid colon: Secondary | ICD-10-CM

## 2023-11-29 DIAGNOSIS — Z860100 Personal history of colon polyps, unspecified: Secondary | ICD-10-CM

## 2023-11-29 DIAGNOSIS — K648 Other hemorrhoids: Secondary | ICD-10-CM

## 2023-11-29 DIAGNOSIS — D123 Benign neoplasm of transverse colon: Secondary | ICD-10-CM | POA: Diagnosis not present

## 2023-11-29 DIAGNOSIS — K5730 Diverticulosis of large intestine without perforation or abscess without bleeding: Secondary | ICD-10-CM | POA: Diagnosis not present

## 2023-11-29 DIAGNOSIS — D214 Benign neoplasm of connective and other soft tissue of abdomen: Secondary | ICD-10-CM

## 2023-11-29 MED ORDER — SODIUM CHLORIDE 0.9 % IV SOLN
500.0000 mL | Freq: Once | INTRAVENOUS | Status: DC
Start: 2023-11-29 — End: 2023-11-29

## 2023-11-29 NOTE — Progress Notes (Signed)
0928 HR < 40 with Robinul 0.2 mg given IV. MD updated, vss

## 2023-11-29 NOTE — Progress Notes (Signed)
Pt's states no medical or surgical changes since previsit or office visit. 

## 2023-11-29 NOTE — Op Note (Signed)
[facility] Patient Name: Terri Sosa Procedure Date: 11/29/2023 9:23 AM MRN: [id number] Endoscopist: Napoleon Form , MD, [id number] Age: 77 Referring MD:  Date of Birth: 1946/04/10 Gender: Female Account #: [id number] Procedure:                Colonoscopy Indications:              High risk colon cancer surveillance: Personal                            history of multiple (3 or more) adenomas Medicines:                Monitored Anesthesia Care, See the Anesthesia note                            for documentation of the administered medications Procedure:                Pre-Anesthesia Assessment:                           - Prior to the procedure, a History and Physical                            was performed, and patient medications and                            allergies were reviewed. The patient's tolerance of                            previous anesthesia was also reviewed. The risks                            and benefits of the procedure and the sedation                            options and risks were discussed with the patient.                            All questions were answered, and informed consent                            was obtained. Prior Anticoagulants: The patient has                            taken no anticoagulant or antiplatelet agents. ASA                            Grade Assessment: II - A patient with mild systemic                            disease. After reviewing the risks and benefits,                            the patient was deemed in satisfactory condition to  undergo the procedure.                           After obtaining informed consent, the colonoscope                            was passed under direct vision. Throughout the                            procedure, the patient's blood pressure, pulse, and                            oxygen saturations were monitored continuously. The                             Olympus Scope SN [id number] was introduced through the                            anus and advanced to the the cecum, identified by                            appendiceal orifice and ileocecal valve. The                            colonoscopy was performed without difficulty. Scope In: 9:27:41 AM Scope Out: 9:45:19 AM Scope Withdrawal Time: 0 hours 12 minutes 9 seconds  Total Procedure Duration: 0 hours 17 minutes 38 seconds  Findings:                 The perianal and digital rectal examinations were                            normal.                           Three sessile polyps were found in the sigmoid                            colon and transverse colon. The polyps were 3 to 7                            mm in size. These polyps were removed with a cold                            snare. Resection and retrieval were complete.                           Scattered small-mouthed diverticula were found in                            the sigmoid colon and descending colon.                           Non-bleeding external and internal hemorrhoids were  found during retroflexion. The hemorrhoids were                            medium-sized. Complications:            No immediate complications. Estimated Blood Loss:     Estimated blood loss was minimal. Impression:               - Three 3 to 7 mm polyps in the sigmoid colon and                            in the transverse colon, removed with a cold snare.                            Resected and retrieved.                           - Diverticulosis in the sigmoid colon and in the                            descending colon.                           - Non-bleeding external and internal hemorrhoids. Recommendation:           - Patient has a contact number available for                            emergencies. The signs and symptoms of potential                            delayed complications were discussed with the                             patient. Return to normal activities tomorrow.                            Written discharge instructions were provided to the                            patient.                           - Resume previous diet.                           - Continue present medications.                           - Await pathology results.                           - Repeat colonoscopy in 3 - 5 years for                            surveillance based on pathology results. Napoleon Form, MD 11/29/2023 9:51:13 AM This report has been  signed electronically.

## 2023-11-29 NOTE — Progress Notes (Signed)
Called to room to assist during endoscopic procedure.  Patient ID and intended procedure confirmed with present staff. Received instructions for my participation in the procedure from the performing physician.  

## 2023-11-29 NOTE — Progress Notes (Signed)
[facility] History and Physical   Primary Care Physician:  Melida Quitter, MD   Reason for Procedure:  History of adenomatous colon polyps  Plan:    Surveillance colonoscopy with possible interventions as needed     HPI: Terri Sosa is a very pleasant 77 y.o. female here for surveillance colonoscopy. Denies any nausea, vomiting, abdominal pain, melena or bright red blood per rectum  The risks and benefits as well as alternatives of endoscopic procedure(s) have been discussed and reviewed. All questions answered. The patient agrees to proceed.    Past Medical History:  Diagnosis Date   Allergy    Colon polyps    Essential hypertension, benign    GERD (gastroesophageal reflux disease)    Hearing loss in left ear    Hypertension    Mixed hyperlipidemia    Psoriasis    Dr. Emily Filbert   Skin cancer    Thyroid disease     Past Surgical History:  Procedure Laterality Date   ABDOMINAL HYSTERECTOMY     partial   COLONOSCOPY     PELVIC FLOOR REPAIR  2017   WRIST SURGERY Right     Prior to Admission medications   Medication Sig Start Date End Date Taking? Authorizing Provider  amLODipine (NORVASC) 5 MG tablet Take 1 tablet (5 mg total) by mouth daily. 06/01/23 05/31/24 Yes Glade Lloyd, MD  aspirin EC 81 MG tablet Take 81 mg by mouth in the morning. Swallow whole.   Yes [provider]  calcium carbonate (OS-CAL) 600 MG TABS Take 600 mg by mouth 2 (two) times daily with a meal.     Yes [provider]  cetirizine (ZYRTEC) 10 MG tablet Take 10 mg by mouth daily.   Yes [provider]  Cyanocobalamin (VITAMIN B 12 PO) Take 1 tablet by mouth daily.   Yes [provider]  estradiol (VIVELLE-DOT) 0.025 MG/24HR Place 0.5 patches onto the skin 2 (two) times a week.     Yes [provider]  levothyroxine (SYNTHROID) 100 MCG tablet Take 100 mcg by mouth daily before breakfast.   Yes [provider]   losartan-hydrochlorothiazide (HYZAAR) 100-25 MG tablet Take 1 tablet by mouth daily. 06/08/23  Yes [provider]  Omega-3 Fatty Acids (FISH OIL) 1000 MG CAPS Take 1 capsule by mouth daily.   Yes [provider]  omeprazole (PRILOSEC) 40 MG capsule TAKE 1 CAPSULE BY MOUTH IN  THE MORNING AND AT BEDTIME Patient taking differently: Take 40 mg by mouth 2 (two) times daily. Take 1 capsule by mouth in the morning and at bedtime. 07/19/20  Yes Tressia Danas, MD  polyethylene glycol powder (GLYCOLAX/MIRALAX) 17 GM/SCOOP powder Take 1 Container by mouth daily. Take 1 capful daily in the Morning.   Yes [provider]  rosuvastatin (CRESTOR) 10 MG tablet Take 10 mg by mouth at bedtime. 09/10/20  Yes [provider]  EPINEPHrine 0.3 mg/0.3 mL IJ SOAJ injection Inject 0.3 mg into the muscle as needed for anaphylaxis (Worsening tongue/throat swelling). 06/01/23   Glade Lloyd, MD  famotidine (PEPCID) 20 MG tablet Take 1 tablet (20 mg total) by mouth 2 (two) times daily. Patient not taking: Reported on 11/29/2023 06/06/23   Ellamae Sia, DO  Lifitegrast Benay Spice) 5 % SOLN Apply 1 drop to eye daily. Patient not taking: Reported on 11/13/2023    [provider]  risankizumab-rzaa,150 MG Dose, (SKRIZI) 75 MG/0.83ML PSKT Inject into the skin. 06/06/18   [provider]  Semaglutide (OZEMPIC, 0.25 OR 0.5 MG/DOSE, [place]) Inject into the skin.    [provider]    Current Outpatient Medications  Medication Sig Dispense Refill   amLODipine (NORVASC) 5 MG tablet Take 1 tablet (5 mg total) by mouth daily. 30 tablet 0   aspirin EC 81 MG tablet Take 81 mg by mouth in the morning. Swallow whole.     calcium carbonate (OS-CAL) 600 MG TABS Take 600 mg by mouth 2 (two) times daily with a meal.       cetirizine (ZYRTEC) 10 MG tablet Take 10 mg by mouth daily.     Cyanocobalamin (VITAMIN B 12 PO) Take 1 tablet by mouth daily.     estradiol (VIVELLE-DOT) 0.025  MG/24HR Place 0.5 patches onto the skin 2 (two) times a week.       levothyroxine (SYNTHROID) 100 MCG tablet Take 100 mcg by mouth daily before breakfast.     losartan-hydrochlorothiazide (HYZAAR) 100-25 MG tablet Take 1 tablet by mouth daily.     Omega-3 Fatty Acids (FISH OIL) 1000 MG CAPS Take 1 capsule by mouth daily.     omeprazole (PRILOSEC) 40 MG capsule TAKE 1 CAPSULE BY MOUTH IN  THE MORNING AND AT BEDTIME (Patient taking differently: Take 40 mg by mouth 2 (two) times daily. Take 1 capsule by mouth in the morning and at bedtime.) 180 capsule 3   polyethylene glycol powder (GLYCOLAX/MIRALAX) 17 GM/SCOOP powder Take 1 Container by mouth daily. Take 1 capful daily in the Morning.     rosuvastatin (CRESTOR) 10 MG tablet Take 10 mg by mouth at bedtime.     EPINEPHrine 0.3 mg/0.3 mL IJ SOAJ injection Inject 0.3 mg into the muscle as needed for anaphylaxis (Worsening tongue/throat swelling). 1 each 0   famotidine (PEPCID) 20 MG tablet Take 1 tablet (20 mg total) by mouth 2 (two) times daily. (Patient not taking: Reported on 11/29/2023) 60 tablet 2   Lifitegrast (XIIDRA) 5 % SOLN Apply 1 drop to eye daily. (Patient not taking: Reported on 11/13/2023)     risankizumab-rzaa,150 MG Dose, (SKRIZI) 75 MG/0.83ML PSKT Inject into the skin.     Semaglutide (OZEMPIC, 0.25 OR 0.5 MG/DOSE, [place]) Inject into the skin.     Current Facility-Administered Medications  Medication Dose Route Frequency Provider Last Rate Last Admin   0.9 %  sodium chloride infusion  500 mL Intravenous Once Napoleon Form, MD        Allergies as of 11/29/2023 - Review Complete 11/29/2023  Allergen Reaction Noted   Alpha-gal Anaphylaxis 06/27/2023   Codeine Nausea And Vomiting and Other (See Comments) 01/27/2010   Amoxicillin-pot clavulanate Nausea And Vomiting 01/27/2010   Augmentin [amoxicillin-pot clavulanate] Nausea And Vomiting 06/06/2023   Azithromycin Nausea And Vomiting 03/25/2020   Lisinopril  06/06/2023    Lisinopril-hydrochlorothiazide Other (See Comments) 06/06/2023   Prednisone Other (See Comments) 06/06/2023    Family History  Problem Relation Age of Onset   Asthma Mother    Allergic rhinitis Mother    Allergies Mother    Hypertension Mother    Heart disease Mother    Arthritis Mother    Arthritis Father    Asthma Father    Kidney disease Father    Cancer Sister    Heart disease Sister    Colon cancer Neg Hx    Colon polyps Neg Hx    Esophageal cancer Neg Hx    Rectal cancer Neg Hx    Stomach cancer Neg Hx  Social History   Socioeconomic History   Marital status: Married    Spouse name: Not on file   Number of children: Not on file   Years of education: Not on file   Highest education level: Not on file  Occupational History   Not on file  Tobacco Use   Smoking status: Former    Current packs/day: 0.00    Average packs/day: 1 pack/day for 23.0 years (23.0 ttl pk-yrs)    Types: Cigarettes    Start date: 12/17/1961    Quit date: 12/17/1984    Years since quitting: 38.9    Passive exposure: Past   Smokeless tobacco: Never  Vaping Use   Vaping status: Never Used  Substance and Sexual Activity   Alcohol use: No   Drug use: No   Sexual activity: Not on file  Other Topics Concern   Not on file  Social History Narrative   Not on file   Social Drivers of Health   Financial Resource Strain: Not on file  Food Insecurity: No Food Insecurity (06/01/2023)   Hunger Vital Sign    Worried About Running Out of Food in the Last Year: Never true    Ran Out of Food in the Last Year: Never true  Transportation Needs: No Transportation Needs (06/01/2023)   PRAPARE - Administrator, Civil Service (Medical): No    Lack of Transportation (Non-Medical): No  Physical Activity: Unknown (07/03/2019)   Received from [facility] visits prior to 02/17/2023., [facility] visits prior to 02/17/2023.   Exercise Vital Sign    Days  of Exercise per Week: 0 days    Minutes of Exercise per Session: Not on file  Stress: Not on file  Social Connections: Not on file  Intimate Partner Violence: Not At Risk (06/01/2023)   Humiliation, Afraid, Rape, and Kick questionnaire    Fear of Current or Ex-Partner: No    Emotionally Abused: No    Physically Abused: No    Sexually Abused: No    Review of Systems:  All other review of systems negative except as mentioned in the HPI.  Physical Exam: Vital signs in last 24 hours: BP (!) 165/80   Pulse 66   Temp 98.2 F (36.8 C)   Resp 17   Ht 5\' 7"  (1.702 m)   Wt 192 lb (87.1 kg)   SpO2 99%   BMI 30.07 kg/m  General:   Alert, NAD Lungs:  Clear .   Heart:  Regular rate and rhythm Abdomen:  Soft, nontender and nondistended. Neuro/Psych:  Alert and cooperative. Normal mood and affect. A and O x 3  Reviewed labs, radiology imaging, old records and pertinent past GI work up  Patient is appropriate for planned procedure(s) and anesthesia in an ambulatory setting   K. Scherry Ran , MD [phone]

## 2023-11-29 NOTE — Patient Instructions (Addendum)
-   Resume previous diet. - Continue present medications. - Await pathology results. - Repeat colonoscopy in 3 - 5 years for surveillance based on pathology results.  YOU HAD AN ENDOSCOPIC PROCEDURE TODAY AT THE [facility]:   Refer to the procedure report that was given to you for any specific questions about what was found during the examination.  If the procedure report does not answer your questions, please call your gastroenterologist to clarify.  If you requested that your care partner not be given the details of your procedure findings, then the procedure report has been included in a sealed envelope for you to review at your convenience later.  YOU SHOULD EXPECT: Some feelings of bloating in the abdomen. Passage of more gas than usual.  Walking can help get rid of the air that was put into your GI tract during the procedure and reduce the bloating. If you had a lower endoscopy (such as a colonoscopy or flexible sigmoidoscopy) you may notice spotting of blood in your stool or on the toilet paper. If you underwent a bowel prep for your procedure, you may not have a normal bowel movement for a few days.  Please Note:  You might notice some irritation and congestion in your nose or some drainage.  This is from the oxygen used during your procedure.  There is no need for concern and it should clear up in a day or so.  SYMPTOMS TO REPORT IMMEDIATELY:  Following lower endoscopy (colonoscopy or flexible sigmoidoscopy):  Excessive amounts of blood in the stool  Significant tenderness or worsening of abdominal pains  Swelling of the abdomen that is new, acute  Fever of 100F or higher  For urgent or emergent issues, a gastroenterologist can be reached at any hour by calling [phone]. Do not use MyChart messaging for urgent concerns.    DIET:  We do recommend a small meal at first, but then you may proceed to your regular diet.  Drink plenty of fluids but you should avoid  alcoholic beverages for 24 hours.  ACTIVITY:  You should plan to take it easy for the rest of today and you should NOT DRIVE or use heavy machinery until tomorrow (because of the sedation medicines used during the test).    FOLLOW UP: Our staff will call the number listed on your records the next business day following your procedure.  We will call around 7:15- 8:00 am to check on you and address any questions or concerns that you may have regarding the information given to you following your procedure. If we do not reach you, we will leave a message.     If any biopsies were taken you will be contacted by phone or by letter within the next 1-3 weeks.  Please call us at [phone] if you have not heard about the biopsies in 3 weeks.    SIGNATURES/CONFIDENTIALITY: You and/or your care partner have signed paperwork which will be entered into your electronic medical record.  These signatures attest to the fact that that the information above on your After Visit Summary has been reviewed and is understood.  Full responsibility of the confidentiality of this discharge information lies with you and/or your care-partner.

## 2023-11-29 NOTE — Progress Notes (Signed)
Report given to PACU, vss 

## 2023-11-29 NOTE — Progress Notes (Signed)
0945 Ephedrine 10 mg given IV due to low BP, MD updated.

## 2023-11-30 ENCOUNTER — Telehealth: Payer: Self-pay | Admitting: *Deleted

## 2023-11-30 NOTE — Telephone Encounter (Signed)
  Follow up Call-     11/29/2023    9:02 AM  Call back number  Post procedure Call Back phone  # [phone]  Permission to leave phone message Yes     Patient questions:  Do you have a fever, pain , or abdominal swelling? No. Pain Score  0 *  Have you tolerated food without any problems? Yes.    Have you been able to return to your normal activities? Yes.    Do you have any questions about your discharge instructions: Diet   No. Medications  No. Follow up visit             No  Do you have questions or concerns about your Care? No.  Actions: * If pain score is 4 or above: No action needed, pain <4.

## 2023-12-04 LAB — SURGICAL PATHOLOGY

## 2023-12-20 ENCOUNTER — Encounter: Payer: Self-pay | Admitting: Gastroenterology

## 2023-12-25 NOTE — Progress Notes (Signed)
 Follow Up Note  RE: Terri Sosa MRN: [id number] DOB: 1946/11/12 Date of Office Visit: 12/26/2023  Referring provider: Stephane Leita DEL, MD Primary care provider: Stephane Leita DEL, MD  Chief Complaint: Follow-up (Alpha Gal Doren she is her for blood work.)  History of Present Illness: I had the pleasure of seeing Terri Sosa for a follow up visit at the [facility] on 12/26/2023. She is a 78 y.o. 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 Her previous allergy office visit was on 06/27/2023 with Dr. Luke. Today is a regular follow up visit.  Discussed the use of AI scribe software for clinical note transcription with the patient, who gave verbal consent to proceed.  The patient, with a history of alpha-gal allergy, presented for a follow-up visit. She has been adhering to a diet devoid of red meat, primarily consuming chicken, turkey, and fish. Since the last visit, she has not experienced any additional swelling episodes and has been avoiding Lisinopril. She reported a tick bite in August or September, but it did not result in any adverse effects.  The patient has been started on Ozempic for prediabetes. She has not had any new medical diagnoses or surgeries since the last visit.   She carries an Epipen  for emergency use in case of an allergic reaction. The patient expressed a desire to recheck her alpha-gal levels.     Assessment and Plan: Terri Sosa is a 78 y.o. 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 No immediate relief with epi. No prior reaction like this. Patient was on lisinopril for 20 years and this was stopped. She also had a tick bite in May and had spaghetti with ground beef a few hours before onset. 2024 bloodwork positive to alpha-gal, C1 esterase inhibitor low, normal C3, slightly elevated C4. C1 esterase function, c1Q, spep, upep, cbc diff,  cmp, ana, tryptase, hymenoptera panel, fire  ant, tsh, sed rate all normal. Repeat C1 esterase normal.  Interim history - no additional episodes. Off lisinopril and avoiding red meat. Keep track of episodes and if you have one - let us  know. Continue to avoid lisinopril and avoid all ACE-inhibitors in the future. Try to avoid NSAIDs if possible as this class has been known to lower threshhold for urticaria and angioedema.   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 Continue strict avoidance of all mammalian meat. For mild symptoms you can take over the counter antihistamines such as Benadryl  1-2 tablets = 25-50mg  and monitor symptoms closely.  If symptoms worsen or if you have severe symptoms including breathing issues, throat closure, significant swelling, whole body hives, severe diarrhea and vomiting, lightheadedness then seek immediate medical care. Action plan in place. Get bloodwork. If positive, will recheck in 1 year. If negative, will recommend in office food challenge next.   Return in about 1 year (around 12/25/2024).  No orders of the defined types were placed in this encounter.  Lab Orders         Alpha-Gal Panel      Diagnostics: None.   Medication List:  Current Outpatient Medications  Medication Sig Dispense Refill   amLODipine  (NORVASC ) 5 MG tablet Take 1 tablet (5 mg total) by mouth daily. 30 tablet 0   aspirin  EC 81 MG tablet Take 81 mg by mouth in the morning. Swallow whole.     calcium   carbonate (OS-CAL) 600 MG TABS Take 600 mg by mouth 2 (two) times daily with a meal.       cetirizine (ZYRTEC) 10 MG tablet Take 10 mg by mouth daily.     Cyanocobalamin (VITAMIN B 12 PO) Take 1 tablet by mouth daily.     EPINEPHrine  0.3 mg/0.3 mL IJ SOAJ injection Inject 0.3 mg into the muscle as needed for anaphylaxis (Worsening tongue/throat swelling). 1 each 0   estradiol (VIVELLE-DOT) 0.025 MG/24HR Place 0.5 patches onto the skin 2 (two) times a  week.       famotidine  (PEPCID ) 20 MG tablet Take 1 tablet (20 mg total) by mouth 2 (two) times daily. 60 tablet 2   levothyroxine  (SYNTHROID ) 100 MCG tablet Take 100 mcg by mouth daily before breakfast.     Lifitegrast  (XIIDRA ) 5 % SOLN Apply 1 drop to eye daily.     losartan -hydrochlorothiazide  (HYZAAR) 100-25 MG tablet Take 1 tablet by mouth daily.     Omega-3 Fatty Acids (FISH OIL) 1000 MG CAPS Take 1 capsule by mouth daily.     omeprazole  (PRILOSEC) 40 MG capsule TAKE 1 CAPSULE BY MOUTH IN  THE MORNING AND AT BEDTIME (Patient taking differently: Take 40 mg by mouth 2 (two) times daily. Take 1 capsule by mouth in the morning and at bedtime.) 180 capsule 3   polyethylene glycol powder (GLYCOLAX/MIRALAX) 17 GM/SCOOP powder Take 1 Container by mouth daily. Take 1 capful daily in the Morning.     risankizumab-rzaa,150 MG Dose, (SKRIZI) 75 MG/0.83ML PSKT Inject into the skin.     rosuvastatin  (CRESTOR ) 10 MG tablet Take 10 mg by mouth at bedtime.     Semaglutide (OZEMPIC, 0.25 OR 0.5 MG/DOSE, [place]) Inject into the skin.     No current facility-administered medications for this visit.   Allergies: Allergies  Allergen Reactions   Alpha-Gal Anaphylaxis   Codeine Nausea And Vomiting and Other (See Comments)    REACTION: nausea/vomiting   Amoxicillin-Pot Clavulanate Nausea And Vomiting    REACTION: nausea/vomiting   Augmentin [Amoxicillin-Pot Clavulanate] Nausea And Vomiting   Azithromycin Nausea And Vomiting   Lisinopril     angioedema   Lisinopril-Hydrochlorothiazide  Other (See Comments)    Not sure    Prednisone Other (See Comments)    Feels like guts are shaking   I reviewed her past medical history, social history, family history, and environmental history and no significant changes have been reported from her previous visit.  Review of Systems  Constitutional:  Negative for appetite change, chills, fever and unexpected weight change.  HENT:  Negative for congestion and rhinorrhea.    Eyes:  Negative for itching.  Respiratory:  Negative for cough, chest tightness, shortness of breath and wheezing.   Cardiovascular:  Negative for chest pain.  Gastrointestinal:  Negative for abdominal pain.  Genitourinary:  Negative for difficulty urinating.  Skin:  Negative for rash.  Allergic/Immunologic: Positive for food allergies.  Neurological:  Negative for headaches.    Objective: There were no vitals taken for this visit. There is no height or weight on file to calculate BMI. Physical Exam Vitals and nursing note reviewed.  Constitutional:      Appearance: Normal appearance. She is well-developed.  HENT:     Head: Normocephalic and atraumatic.     Right Ear: Tympanic membrane and external ear normal.     Left Ear: Tympanic membrane and external ear normal.     Nose: Nose normal.     Mouth/Throat:  Mouth: Mucous membranes are moist.     Pharynx: Oropharynx is clear.  Eyes:     Conjunctiva/sclera: Conjunctivae normal.  Cardiovascular:     Rate and Rhythm: Normal rate and regular rhythm.     Heart sounds: Normal heart sounds. No murmur heard.    No friction rub. No gallop.  Pulmonary:     Effort: Pulmonary effort is normal.     Breath sounds: Normal breath sounds. No wheezing, rhonchi or rales.  Musculoskeletal:     Cervical back: Neck supple.  Skin:    General: Skin is warm.     Findings: No rash.  Neurological:     Mental Status: She is alert and oriented to person, place, and time.  Psychiatric:        Behavior: Behavior normal.    Previous notes and tests were reviewed. The plan was reviewed with the patient/family, and all questions/concerned were addressed.  It was my pleasure to see Terri Sosa today and participate in her care. Please feel free to contact me with any questions or concerns.  Sincerely,  Orlan Cramp, DO Allergy & Immunology  [facility]  [facility] office: [phone] [facility] office: [phone]

## 2023-12-26 ENCOUNTER — Other Ambulatory Visit: Payer: Self-pay

## 2023-12-26 ENCOUNTER — Encounter: Payer: Self-pay | Admitting: Allergy

## 2023-12-26 ENCOUNTER — Ambulatory Visit (INDEPENDENT_AMBULATORY_CARE_PROVIDER_SITE_OTHER): Payer: Medicare Other | Admitting: Allergy

## 2023-12-26 VITALS — BP 134/70 | HR 61 | Temp 98.2°F | Resp 12 | Ht 67.0 in | Wt 188.1 lb

## 2023-12-26 DIAGNOSIS — T783XXD Angioneurotic edema, subsequent encounter: Secondary | ICD-10-CM | POA: Diagnosis not present

## 2023-12-26 DIAGNOSIS — Z91018 Allergy to other foods: Secondary | ICD-10-CM | POA: Diagnosis not present

## 2023-12-26 NOTE — Patient Instructions (Addendum)
 Swelling Keep track of episodes and if you have one - let us  know. Continue to avoid lisinopril and avoid all ACE-inhibitors in the future. Try to avoid NSAIDs if possible as this class has been known to lower threshhold for urticaria and angioedema.  Alpha gal allergy  Continue strict avoidance of all mammalian meat. For mild symptoms you can take over the counter antihistamines such as Benadryl  1-2 tablets = 25-50mg  and monitor symptoms closely.  If symptoms worsen or if you have severe symptoms including breathing issues, throat closure, significant swelling, whole body hives, severe diarrhea and vomiting, lightheadedness then seek immediate medical care. Action plan in place.  Get bloodwork If positive, will recheck in 1 year. If negative, will recommend in office food challenge next.  We are ordering labs, so please allow 1-2 weeks for the results to come back. With the newly implemented Cures Act, the labs might be visible to you at the same time that they become visible to me. However, I will not address the results until all of the results are back, so please be patient.  In the meantime, continue recommendations in your patient instructions, including avoidance measures (if applicable), until you hear from me.   Follow up in 12 months or sooner if needed.

## 2023-12-27 ENCOUNTER — Encounter: Payer: Self-pay | Admitting: Gastroenterology

## 2023-12-30 LAB — ALPHA-GAL PANEL
Allergen Lamb IgE: 3.01 kU/L — AB
Beef IgE: 8.93 kU/L — AB
IgE (Immunoglobulin E), Serum: 200 [IU]/mL (ref 6–495)
O215-IgE Alpha-Gal: 26.7 kU/L — AB
Pork IgE: 2.86 kU/L — AB

## 2024-01-17 DIAGNOSIS — Z1231 Encounter for screening mammogram for malignant neoplasm of breast: Secondary | ICD-10-CM | POA: Diagnosis not present

## 2024-02-15 DIAGNOSIS — E8809 Other disorders of plasma-protein metabolism, not elsewhere classified: Secondary | ICD-10-CM | POA: Diagnosis not present

## 2024-02-15 DIAGNOSIS — E785 Hyperlipidemia, unspecified: Secondary | ICD-10-CM | POA: Diagnosis not present

## 2024-02-15 DIAGNOSIS — Z87891 Personal history of nicotine dependence: Secondary | ICD-10-CM | POA: Diagnosis not present

## 2024-02-15 DIAGNOSIS — I10 Essential (primary) hypertension: Secondary | ICD-10-CM | POA: Diagnosis not present

## 2024-02-15 DIAGNOSIS — Z79890 Hormone replacement therapy (postmenopausal): Secondary | ICD-10-CM | POA: Diagnosis not present

## 2024-02-15 DIAGNOSIS — K7581 Nonalcoholic steatohepatitis (NASH): Secondary | ICD-10-CM | POA: Diagnosis not present

## 2024-02-15 DIAGNOSIS — E669 Obesity, unspecified: Secondary | ICD-10-CM | POA: Diagnosis not present

## 2024-02-15 DIAGNOSIS — E1159 Type 2 diabetes mellitus with other circulatory complications: Secondary | ICD-10-CM | POA: Diagnosis not present

## 2024-03-05 DIAGNOSIS — L570 Actinic keratosis: Secondary | ICD-10-CM | POA: Diagnosis not present

## 2024-03-05 DIAGNOSIS — L578 Other skin changes due to chronic exposure to nonionizing radiation: Secondary | ICD-10-CM | POA: Diagnosis not present

## 2024-03-05 DIAGNOSIS — Z86018 Personal history of other benign neoplasm: Secondary | ICD-10-CM | POA: Diagnosis not present

## 2024-03-05 DIAGNOSIS — L720 Epidermal cyst: Secondary | ICD-10-CM | POA: Diagnosis not present

## 2024-03-05 DIAGNOSIS — Z85828 Personal history of other malignant neoplasm of skin: Secondary | ICD-10-CM | POA: Diagnosis not present

## 2024-03-05 DIAGNOSIS — D225 Melanocytic nevi of trunk: Secondary | ICD-10-CM | POA: Diagnosis not present

## 2024-03-05 DIAGNOSIS — L821 Other seborrheic keratosis: Secondary | ICD-10-CM | POA: Diagnosis not present

## 2024-03-05 DIAGNOSIS — L400 Psoriasis vulgaris: Secondary | ICD-10-CM | POA: Diagnosis not present

## 2024-03-05 DIAGNOSIS — Z79899 Other long term (current) drug therapy: Secondary | ICD-10-CM | POA: Diagnosis not present

## 2024-04-09 DIAGNOSIS — H57813 Brow ptosis, bilateral: Secondary | ICD-10-CM | POA: Diagnosis not present

## 2024-04-09 DIAGNOSIS — L578 Other skin changes due to chronic exposure to nonionizing radiation: Secondary | ICD-10-CM | POA: Diagnosis not present

## 2024-04-09 DIAGNOSIS — H02413 Mechanical ptosis of bilateral eyelids: Secondary | ICD-10-CM | POA: Diagnosis not present

## 2024-04-09 DIAGNOSIS — H02412 Mechanical ptosis of left eyelid: Secondary | ICD-10-CM | POA: Diagnosis not present

## 2024-04-09 DIAGNOSIS — Z01818 Encounter for other preprocedural examination: Secondary | ICD-10-CM | POA: Diagnosis not present

## 2024-04-09 DIAGNOSIS — H02834 Dermatochalasis of left upper eyelid: Secondary | ICD-10-CM | POA: Diagnosis not present

## 2024-04-09 DIAGNOSIS — H02831 Dermatochalasis of right upper eyelid: Secondary | ICD-10-CM | POA: Diagnosis not present

## 2024-04-09 DIAGNOSIS — H53483 Generalized contraction of visual field, bilateral: Secondary | ICD-10-CM | POA: Diagnosis not present

## 2024-04-09 DIAGNOSIS — H0279 Other degenerative disorders of eyelid and periocular area: Secondary | ICD-10-CM | POA: Diagnosis not present

## 2024-04-09 DIAGNOSIS — H02411 Mechanical ptosis of right eyelid: Secondary | ICD-10-CM | POA: Diagnosis not present

## 2024-05-16 DIAGNOSIS — Z961 Presence of intraocular lens: Secondary | ICD-10-CM | POA: Diagnosis not present

## 2024-05-22 DIAGNOSIS — H02831 Dermatochalasis of right upper eyelid: Secondary | ICD-10-CM | POA: Diagnosis not present

## 2024-05-22 DIAGNOSIS — H5340 Unspecified visual field defects: Secondary | ICD-10-CM | POA: Diagnosis not present

## 2024-05-29 DIAGNOSIS — H02831 Dermatochalasis of right upper eyelid: Secondary | ICD-10-CM | POA: Diagnosis not present

## 2024-07-15 DIAGNOSIS — E785 Hyperlipidemia, unspecified: Secondary | ICD-10-CM | POA: Diagnosis not present

## 2024-07-15 DIAGNOSIS — K7581 Nonalcoholic steatohepatitis (NASH): Secondary | ICD-10-CM | POA: Diagnosis not present

## 2024-07-15 DIAGNOSIS — Z87891 Personal history of nicotine dependence: Secondary | ICD-10-CM | POA: Diagnosis not present

## 2024-07-15 DIAGNOSIS — E669 Obesity, unspecified: Secondary | ICD-10-CM | POA: Diagnosis not present

## 2024-07-15 DIAGNOSIS — Z6825 Body mass index (BMI) 25.0-25.9, adult: Secondary | ICD-10-CM | POA: Diagnosis not present

## 2024-07-15 DIAGNOSIS — I10 Essential (primary) hypertension: Secondary | ICD-10-CM | POA: Diagnosis not present

## 2024-07-15 DIAGNOSIS — E1159 Type 2 diabetes mellitus with other circulatory complications: Secondary | ICD-10-CM | POA: Diagnosis not present

## 2024-08-07 DIAGNOSIS — H2513 Age-related nuclear cataract, bilateral: Secondary | ICD-10-CM | POA: Diagnosis not present

## 2024-09-04 DIAGNOSIS — D225 Melanocytic nevi of trunk: Secondary | ICD-10-CM | POA: Diagnosis not present

## 2024-09-04 DIAGNOSIS — L400 Psoriasis vulgaris: Secondary | ICD-10-CM | POA: Diagnosis not present

## 2024-09-04 DIAGNOSIS — Z79899 Other long term (current) drug therapy: Secondary | ICD-10-CM | POA: Diagnosis not present

## 2024-09-04 DIAGNOSIS — Z85828 Personal history of other malignant neoplasm of skin: Secondary | ICD-10-CM | POA: Diagnosis not present

## 2024-09-04 DIAGNOSIS — L720 Epidermal cyst: Secondary | ICD-10-CM | POA: Diagnosis not present

## 2024-09-04 DIAGNOSIS — L578 Other skin changes due to chronic exposure to nonionizing radiation: Secondary | ICD-10-CM | POA: Diagnosis not present

## 2024-09-04 DIAGNOSIS — L821 Other seborrheic keratosis: Secondary | ICD-10-CM | POA: Diagnosis not present

## 2024-09-04 DIAGNOSIS — Z86018 Personal history of other benign neoplasm: Secondary | ICD-10-CM | POA: Diagnosis not present

## 2024-10-08 DIAGNOSIS — I10 Essential (primary) hypertension: Secondary | ICD-10-CM | POA: Diagnosis not present

## 2024-10-08 DIAGNOSIS — E785 Hyperlipidemia, unspecified: Secondary | ICD-10-CM | POA: Diagnosis not present

## 2024-10-08 DIAGNOSIS — E039 Hypothyroidism, unspecified: Secondary | ICD-10-CM | POA: Diagnosis not present

## 2024-10-08 DIAGNOSIS — E1159 Type 2 diabetes mellitus with other circulatory complications: Secondary | ICD-10-CM | POA: Diagnosis not present

## 2024-10-08 DIAGNOSIS — E7849 Other hyperlipidemia: Secondary | ICD-10-CM | POA: Diagnosis not present

## 2024-10-15 DIAGNOSIS — Z1331 Encounter for screening for depression: Secondary | ICD-10-CM | POA: Diagnosis not present

## 2024-10-15 DIAGNOSIS — Z85828 Personal history of other malignant neoplasm of skin: Secondary | ICD-10-CM | POA: Diagnosis not present

## 2024-10-15 DIAGNOSIS — L409 Psoriasis, unspecified: Secondary | ICD-10-CM | POA: Diagnosis not present

## 2024-10-15 DIAGNOSIS — E1159 Type 2 diabetes mellitus with other circulatory complications: Secondary | ICD-10-CM | POA: Diagnosis not present

## 2024-10-15 DIAGNOSIS — E8809 Other disorders of plasma-protein metabolism, not elsewhere classified: Secondary | ICD-10-CM | POA: Diagnosis not present

## 2024-10-15 DIAGNOSIS — Z87891 Personal history of nicotine dependence: Secondary | ICD-10-CM | POA: Diagnosis not present

## 2024-10-15 DIAGNOSIS — Z1339 Encounter for screening examination for other mental health and behavioral disorders: Secondary | ICD-10-CM | POA: Diagnosis not present

## 2024-10-15 DIAGNOSIS — E039 Hypothyroidism, unspecified: Secondary | ICD-10-CM | POA: Diagnosis not present

## 2024-10-15 DIAGNOSIS — Z860100 Personal history of colon polyps, unspecified: Secondary | ICD-10-CM | POA: Diagnosis not present

## 2024-10-15 DIAGNOSIS — I10 Essential (primary) hypertension: Secondary | ICD-10-CM | POA: Diagnosis not present

## 2024-10-15 DIAGNOSIS — E785 Hyperlipidemia, unspecified: Secondary | ICD-10-CM | POA: Diagnosis not present

## 2024-10-15 DIAGNOSIS — Z0000 Encounter for general adult medical examination without abnormal findings: Secondary | ICD-10-CM | POA: Diagnosis not present

## 2024-12-28 NOTE — Progress Notes (Unsigned)
 "  Follow Up Note  RE: Terri Sosa MRN: [id number] DOB: 1946/09/13 Date of Office Visit: 12/29/2024  Referring provider: Stephane Leita DEL, MD Primary care provider: Stephane Leita DEL, MD  Chief Complaint: No chief complaint on file.  History of Present Illness: I had the pleasure of seeing Terri Sosa for a follow up visit at the [facility] on 12/29/2024. She is a 79 y.o. female, who is being followed for alpha gal allergy, angioedema. Her previous allergy office visit was on 12/26/2023 with Dr. Luke. Today is a regular follow up visit.  Discussed the use of AI scribe software for clinical note transcription with the patient, who gave verbal consent to proceed.  History of Present Illness            2025 labs: Alpha gal is still positive but lower than before. Continue to avoid all mammalian meat. Recommend recheck in 1 year.  Component     Latest Ref Rng 06/06/2023 12/26/2023  Class Description Allergens Comment  Comment   IgE (Immunoglobulin E), Serum     6 - 495 IU/mL 353  200   [id number] Alpha-Gal     Class V kU/L 66.50 !  26.70 !   Beef IgE     Class IV kU/L 12.80 !  8.93 !   Pork IgE     Class III kU/L 6.21 !  2.86 !   Allergen Lamb IgE     Class III kU/L 10.00 !  3.01 !    Assessment and Plan: Terri Sosa is a 79 y.o. 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 No immediate relief with epi. No prior reaction like this. Patient was on lisinopril for 20 years and this was stopped. She also had a tick bite in May and had spaghetti with ground beef a few hours before onset. 2024 bloodwork positive to alpha-gal, C1 esterase inhibitor low, normal C3, slightly elevated C4. C1 esterase function, c1Q, spep, upep, cbc diff, cmp, ana, tryptase, hymenoptera panel, fire  ant, tsh, sed rate all normal. Repeat C1 esterase normal.  Interim history - no additional episodes. Off  lisinopril and avoiding red meat. Keep track of episodes and if you have one - let us  know. Continue to avoid lisinopril and avoid all ACE-inhibitors in the future. Try to avoid NSAIDs if possible as this class has been known to lower threshhold for urticaria and angioedema.   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 Continue strict avoidance of all mammalian meat. For mild symptoms you can take over the counter antihistamines such as Benadryl  1-2 tablets = 25-50mg  and monitor symptoms closely.  If symptoms worsen or if you have severe symptoms including breathing issues, throat closure, significant swelling, whole body hives, severe diarrhea and vomiting, lightheadedness then seek immediate medical care. Action plan in place. Get bloodwork. If positive, will recheck in 1 year. If negative, will recommend in office food challenge next.  Assessment and Plan              No follow-ups on file.  No orders of the defined types were placed in this encounter.  Lab Orders  No laboratory test(s) ordered today    Diagnostics: Spirometry:  Tracings reviewed. Her effort: {Blank single:19197::Good reproducible efforts.,It was hard to get consistent efforts and there is a question as to whether this reflects a maximal maneuver.,Poor effort, data can  not be interpreted.} FVC: ***L FEV1: ***L, ***% predicted FEV1/FVC ratio: ***% Interpretation: {Blank single:19197::Spirometry consistent with mild obstructive disease,Spirometry consistent with moderate obstructive disease,Spirometry consistent with severe obstructive disease,Spirometry consistent with possible restrictive disease,Spirometry consistent with mixed obstructive and restrictive disease,Spirometry uninterpretable due to technique,Spirometry consistent with normal pattern,No overt abnormalities noted given today's efforts}.  Please see scanned spirometry results for details.  Skin  Testing: {Blank single:19197::Select foods,Environmental allergy panel,Environmental allergy panel and select foods,Food allergy panel,None,Deferred due to recent antihistamines use}. *** Results discussed with patient/family.   Medication List:  Current Outpatient Medications  Medication Sig Dispense Refill   amLODipine  (NORVASC ) 5 MG tablet Take 1 tablet (5 mg total) by mouth daily. 30 tablet 0   aspirin  EC 81 MG tablet Take 81 mg by mouth in the morning. Swallow whole.     calcium  carbonate (OS-CAL) 600 MG TABS Take 600 mg by mouth 2 (two) times daily with a meal.       cetirizine (ZYRTEC) 10 MG tablet Take 10 mg by mouth daily.     Cyanocobalamin (VITAMIN B 12 PO) Take 1 tablet by mouth daily.     EPINEPHrine  0.3 mg/0.3 mL IJ SOAJ injection Inject 0.3 mg into the muscle as needed for anaphylaxis (Worsening tongue/throat swelling). 1 each 0   estradiol (VIVELLE-DOT) 0.025 MG/24HR Place 0.5 patches onto the skin 2 (two) times a week.       famotidine  (PEPCID ) 20 MG tablet Take 1 tablet (20 mg total) by mouth 2 (two) times daily. 60 tablet 2   levothyroxine  (SYNTHROID ) 100 MCG tablet Take 100 mcg by mouth daily before breakfast.     Lifitegrast  (XIIDRA ) 5 % SOLN Apply 1 drop to eye daily.     losartan -hydrochlorothiazide  (HYZAAR) 100-25 MG tablet Take 1 tablet by mouth daily.     Omega-3 Fatty Acids (FISH OIL) 1000 MG CAPS Take 1 capsule by mouth daily.     omeprazole  (PRILOSEC) 40 MG capsule TAKE 1 CAPSULE BY MOUTH IN  THE MORNING AND AT BEDTIME (Patient taking differently: Take 40 mg by mouth 2 (two) times daily. Take 1 capsule by mouth in the morning and at bedtime.) 180 capsule 3   polyethylene glycol powder (GLYCOLAX/MIRALAX) 17 GM/SCOOP powder Take 1 Container by mouth daily. Take 1 capful daily in the Morning.     risankizumab-rzaa,150 MG Dose, (SKRIZI) 75 MG/0.83ML PSKT Inject into the skin.     rosuvastatin  (CRESTOR ) 10 MG tablet Take 10 mg by mouth at bedtime.      Semaglutide (OZEMPIC, 0.25 OR 0.5 MG/DOSE, [place]) Inject into the skin.     No current facility-administered medications for this visit.   Allergies: Allergies[1] I reviewed her past medical history, social history, family history, and environmental history and no significant changes have been reported from her previous visit.  Review of Systems  Constitutional:  Negative for appetite change, chills, fever and unexpected weight change.  HENT:  Negative for congestion and rhinorrhea.   Eyes:  Negative for itching.  Respiratory:  Negative for cough, chest tightness, shortness of breath and wheezing.   Cardiovascular:  Negative for chest pain.  Gastrointestinal:  Negative for abdominal pain.  Genitourinary:  Negative for difficulty urinating.  Skin:  Negative for rash.  Allergic/Immunologic: Positive for food allergies.  Neurological:  Negative for headaches.    Objective: There were no vitals taken for this visit. There is no height or weight on file to calculate BMI. Physical Exam Vitals and nursing note reviewed.  Constitutional:  Appearance: Normal appearance. She is well-developed.  HENT:     Head: Normocephalic and atraumatic.     Right Ear: Tympanic membrane and external ear normal.     Left Ear: Tympanic membrane and external ear normal.     Nose: Nose normal.     Mouth/Throat:     Mouth: Mucous membranes are moist.     Pharynx: Oropharynx is clear.  Eyes:     Conjunctiva/sclera: Conjunctivae normal.  Cardiovascular:     Rate and Rhythm: Normal rate and regular rhythm.     Heart sounds: Normal heart sounds. No murmur heard.    No friction rub. No gallop.  Pulmonary:     Effort: Pulmonary effort is normal.     Breath sounds: Normal breath sounds. No wheezing, rhonchi or rales.  Musculoskeletal:     Cervical back: Neck supple.  Skin:    General: Skin is warm.     Findings: No rash.  Neurological:     Mental Status: She is alert and oriented to person, place, and  time.  Psychiatric:        Behavior: Behavior normal.    Previous notes and tests were reviewed. The plan was reviewed with the patient/family, and all questions/concerned were addressed.  It was my pleasure to see Terri Sosa today and participate in her care. Please feel free to contact me with any questions or concerns.  Sincerely,  Orlan Cramp, DO Allergy & Immunology  [facility]  [facility] office: [phone] [facility] office: [phone]    [1]  Allergies Allergen Reactions   Alpha-Gal Anaphylaxis   Codeine Nausea And Vomiting and Other (See Comments)    REACTION: nausea/vomiting   Amoxicillin-Pot Clavulanate Nausea And Vomiting    REACTION: nausea/vomiting   Augmentin [Amoxicillin-Pot Clavulanate] Nausea And Vomiting   Azithromycin Nausea And Vomiting   Lisinopril     angioedema   Lisinopril-Hydrochlorothiazide  Other (See Comments)    Not sure    Prednisone Other (See Comments)    Feels like guts are shaking   "

## 2024-12-29 ENCOUNTER — Ambulatory Visit: Payer: PPO | Admitting: Allergy

## 2024-12-29 ENCOUNTER — Other Ambulatory Visit: Payer: Self-pay

## 2024-12-29 ENCOUNTER — Encounter: Payer: Self-pay | Admitting: Allergy

## 2024-12-29 VITALS — BP 136/86 | HR 54 | Temp 98.6°F | Resp 14 | Ht 66.75 in | Wt 162.3 lb

## 2024-12-29 DIAGNOSIS — Z91018 Allergy to other foods: Secondary | ICD-10-CM

## 2024-12-29 DIAGNOSIS — T783XXD Angioneurotic edema, subsequent encounter: Secondary | ICD-10-CM | POA: Diagnosis not present

## 2024-12-29 NOTE — Patient Instructions (Addendum)
 Swelling Keep track of episodes and if you have one - let us  know. Continue to avoid lisinopril and avoid all ACE-inhibitors in the future. Try to avoid NSAIDs if possible as this class has been known to lower threshhold for urticaria and angioedema.  Alpha gal allergy  Continue strict avoidance of all mammalian meat. For mild symptoms you can take over the counter antihistamines (zyrtec 10mg  to 20mg ) and monitor symptoms closely.  If symptoms worsen or if you have severe symptoms including breathing issues, throat closure, significant swelling, whole body hives, severe diarrhea and vomiting, lightheadedness then use epinephrine  and seek immediate medical care afterwards. Emergency action plan updated.  Continue to avoid tick bites.   Get bloodwork If positive, will recheck in 1 year. If negative, will recommend in office food challenge next.  We are ordering labs, so please allow 1-2 weeks for the results to come back. With the newly implemented Cures Act, the labs might be visible to you at the same time that they become visible to me. However, I will not address the results until all of the results are back, so please be patient.  In the meantime, continue recommendations in your patient instructions, including avoidance measures (if applicable), until you hear from me.   Follow up in 12 months or sooner if needed.

## 2025-01-01 ENCOUNTER — Ambulatory Visit: Payer: Self-pay | Admitting: Allergy

## 2025-01-01 LAB — ALPHA-GAL PANEL
Allergen Lamb IgE: 0.71 kU/L — AB
Beef IgE: 1.78 kU/L — AB
IgE (Immunoglobulin E), Serum: 104 [IU]/mL (ref 6–495)
O215-IgE Alpha-Gal: 4.32 kU/L — AB
Pork IgE: 0.52 kU/L — AB

## 2025-12-29 ENCOUNTER — Ambulatory Visit: Admitting: Allergy
